# Patient Record
Sex: Female | Born: 1970
Health system: Southern US, Community
[De-identification: ages and names within clinical notes are randomized; demographics above are authoritative.]

## PROBLEM LIST (undated history)

## (undated) DIAGNOSIS — F419 Anxiety disorder, unspecified: Secondary | ICD-10-CM

## (undated) DIAGNOSIS — T7840XA Allergy, unspecified, initial encounter: Secondary | ICD-10-CM

## (undated) DIAGNOSIS — K219 Gastro-esophageal reflux disease without esophagitis: Secondary | ICD-10-CM

## (undated) DIAGNOSIS — E785 Hyperlipidemia, unspecified: Secondary | ICD-10-CM

## (undated) DIAGNOSIS — R7303 Prediabetes: Secondary | ICD-10-CM

## (undated) DIAGNOSIS — D649 Anemia, unspecified: Secondary | ICD-10-CM

## (undated) HISTORY — DX: Allergy, unspecified, initial encounter: T78.40XA

## (undated) HISTORY — PX: OTHER SURGICAL HISTORY: SHX169

## (undated) HISTORY — PX: DENTAL RESTORATION/EXTRACTION WITH X-RAY: SHX5796

## (undated) HISTORY — DX: Hyperlipidemia, unspecified: E78.5

## (undated) HISTORY — DX: Anemia, unspecified: D64.9

## (undated) HISTORY — PX: TUBAL LIGATION: SHX77

## (undated) HISTORY — PX: CHOLECYSTECTOMY: SHX55

## (undated) HISTORY — DX: Anxiety disorder, unspecified: F41.9

---

## 1996-12-02 HISTORY — PX: OTHER SURGICAL HISTORY: SHX169

## 1997-12-02 HISTORY — PX: GALLBLADDER SURGERY: SHX652

## 2005-09-26 ENCOUNTER — Emergency Department: Payer: Self-pay | Admitting: Emergency Medicine

## 2006-06-17 ENCOUNTER — Ambulatory Visit: Payer: Self-pay | Admitting: General Surgery

## 2006-08-26 ENCOUNTER — Ambulatory Visit: Payer: Self-pay

## 2007-06-11 ENCOUNTER — Emergency Department: Payer: Self-pay

## 2008-06-06 ENCOUNTER — Ambulatory Visit: Payer: Self-pay

## 2008-06-07 ENCOUNTER — Inpatient Hospital Stay: Payer: Self-pay

## 2011-03-13 ENCOUNTER — Ambulatory Visit: Payer: Self-pay | Admitting: Family Medicine

## 2013-12-31 LAB — HEPATIC FUNCTION PANEL
ALT: 11 U/L (ref 7–35)
AST: 12 U/L — AB (ref 13–35)

## 2013-12-31 LAB — BASIC METABOLIC PANEL
BUN: 10 mg/dL (ref 4–21)
Creatinine: 0.7 mg/dL (ref 0.5–1.1)
Glucose: 94 mg/dL
POTASSIUM: 4.4 mmol/L (ref 3.4–5.3)
SODIUM: 140 mmol/L (ref 137–147)

## 2013-12-31 LAB — CBC AND DIFFERENTIAL
HCT: 34 % — AB (ref 36–46)
HEMOGLOBIN: 10.9 g/dL — AB (ref 12.0–16.0)
Platelets: 363 10*3/uL (ref 150–399)
WBC: 5.3 10*3/mL

## 2013-12-31 LAB — HEMOGLOBIN A1C: Hgb A1c MFr Bld: 6.1 % — AB (ref 4.0–6.0)

## 2013-12-31 LAB — LIPID PANEL
CHOLESTEROL: 183 mg/dL (ref 0–200)
HDL: 62 mg/dL (ref 35–70)
LDL Cholesterol: 105 mg/dL
TRIGLYCERIDES: 81 mg/dL (ref 40–160)

## 2013-12-31 LAB — TSH: TSH: 0.7 u[IU]/mL (ref 0.41–5.90)

## 2015-08-22 DIAGNOSIS — N922 Excessive menstruation at puberty: Secondary | ICD-10-CM | POA: Insufficient documentation

## 2015-08-22 DIAGNOSIS — S8990XA Unspecified injury of unspecified lower leg, initial encounter: Secondary | ICD-10-CM | POA: Insufficient documentation

## 2015-08-22 DIAGNOSIS — S99929A Unspecified injury of unspecified foot, initial encounter: Secondary | ICD-10-CM

## 2015-08-22 DIAGNOSIS — D649 Anemia, unspecified: Secondary | ICD-10-CM | POA: Insufficient documentation

## 2015-08-22 DIAGNOSIS — E7800 Pure hypercholesterolemia, unspecified: Secondary | ICD-10-CM | POA: Insufficient documentation

## 2015-08-22 DIAGNOSIS — J309 Allergic rhinitis, unspecified: Secondary | ICD-10-CM | POA: Insufficient documentation

## 2015-08-22 DIAGNOSIS — R7303 Prediabetes: Secondary | ICD-10-CM | POA: Insufficient documentation

## 2015-08-22 DIAGNOSIS — R079 Chest pain, unspecified: Secondary | ICD-10-CM | POA: Insufficient documentation

## 2015-08-22 DIAGNOSIS — E669 Obesity, unspecified: Secondary | ICD-10-CM | POA: Insufficient documentation

## 2015-08-22 DIAGNOSIS — M25569 Pain in unspecified knee: Secondary | ICD-10-CM | POA: Insufficient documentation

## 2015-08-22 DIAGNOSIS — F419 Anxiety disorder, unspecified: Secondary | ICD-10-CM | POA: Insufficient documentation

## 2015-08-22 DIAGNOSIS — Z8742 Personal history of other diseases of the female genital tract: Secondary | ICD-10-CM | POA: Insufficient documentation

## 2015-08-22 DIAGNOSIS — S99919A Unspecified injury of unspecified ankle, initial encounter: Secondary | ICD-10-CM | POA: Insufficient documentation

## 2015-08-23 ENCOUNTER — Encounter: Payer: Self-pay | Admitting: Physician Assistant

## 2015-08-23 ENCOUNTER — Ambulatory Visit (INDEPENDENT_AMBULATORY_CARE_PROVIDER_SITE_OTHER): Payer: 59 | Admitting: Physician Assistant

## 2015-08-23 VITALS — BP 140/80 | HR 66 | Temp 98.0°F | Resp 16 | Ht 62.0 in | Wt 254.0 lb

## 2015-08-23 DIAGNOSIS — R7309 Other abnormal glucose: Secondary | ICD-10-CM

## 2015-08-23 DIAGNOSIS — Z23 Encounter for immunization: Secondary | ICD-10-CM

## 2015-08-23 DIAGNOSIS — Z713 Dietary counseling and surveillance: Secondary | ICD-10-CM | POA: Diagnosis not present

## 2015-08-23 DIAGNOSIS — E7800 Pure hypercholesterolemia, unspecified: Secondary | ICD-10-CM

## 2015-08-23 DIAGNOSIS — D509 Iron deficiency anemia, unspecified: Secondary | ICD-10-CM

## 2015-08-23 DIAGNOSIS — E559 Vitamin D deficiency, unspecified: Secondary | ICD-10-CM | POA: Diagnosis not present

## 2015-08-23 DIAGNOSIS — Z6841 Body Mass Index (BMI) 40.0 and over, adult: Secondary | ICD-10-CM

## 2015-08-23 DIAGNOSIS — R7303 Prediabetes: Secondary | ICD-10-CM

## 2015-08-23 DIAGNOSIS — Z1239 Encounter for other screening for malignant neoplasm of breast: Secondary | ICD-10-CM | POA: Diagnosis not present

## 2015-08-23 DIAGNOSIS — Z0000 Encounter for general adult medical examination without abnormal findings: Secondary | ICD-10-CM

## 2015-08-23 MED ORDER — PHENTERMINE HCL 37.5 MG PO TABS: 37.5000 mg | ORAL_TABLET | Freq: Every day | ORAL | Status: DC

## 2015-08-23 NOTE — Patient Instructions (Signed)
Health Maintenance Adopting a healthy lifestyle and getting preventive care can go a long way to promote health and wellness. Talk with your health care provider about what schedule of regular examinations is right for you. This is a good chance for you to check in with your provider about disease prevention and staying healthy. In between checkups, there are plenty of things you can do on your own. Experts have done a lot of research about which lifestyle changes and preventive measures are most likely to keep you healthy. Ask your health care provider for more information. WEIGHT AND DIET  Eat a healthy diet 1. Be sure to include plenty of vegetables, fruits, low-fat dairy products, and lean protein. 2. Do not eat a lot of foods high in solid fats, added sugars, or salt. 3. Get regular exercise. This is one of the most important things you can do for your health. 1. Most adults should exercise for at least 150 minutes each week. The exercise should increase your heart rate and make you sweat (moderate-intensity exercise). 2. Most adults should also do strengthening exercises at least twice a week. This is in addition to the moderate-intensity exercise.  Maintain a healthy weight 1. Body mass index (BMI) is a measurement that can be used to identify possible weight problems. It estimates body fat based on height and weight. Your health care provider can help determine your BMI and help you achieve or maintain a healthy weight. 2. For females 6 years of age and older:  1. A BMI below 18.5 is considered underweight. 2. A BMI of 18.5 to 24.9 is normal. 3. A BMI of 25 to 29.9 is considered overweight. 4. A BMI of 30 and above is considered obese.  Watch levels of cholesterol and blood lipids 1. You should start having your blood tested for lipids and cholesterol at 43 years of age, then have this test every 5 years. 2. You may need to have your cholesterol levels checked more often if: 1. Your  lipid or cholesterol levels are high. 2. You are older than 43 years of age. 3. You are at high risk for heart disease.  CANCER SCREENING   Lung Cancer 1. Lung cancer screening is recommended for adults 7-87 years old who are at high risk for lung cancer because of a history of smoking. 2. A yearly low-dose CT scan of the lungs is recommended for people who: 1. Currently smoke. 2. Have quit within the past 15 years. 3. Have at least a 30-pack-year history of smoking. A pack year is smoking an average of one pack of cigarettes a day for 1 year. 3. Yearly screening should continue until it has been 15 years since you quit. 4. Yearly screening should stop if you develop a health problem that would prevent you from having lung cancer treatment.  Breast Cancer  Practice breast self-awareness. This means understanding how your breasts normally appear and feel.  It also means doing regular breast self-exams. Let your health care provider know about any changes, no matter how small.  If you are in your 20s or 30s, you should have a clinical breast exam (CBE) by a health care provider every 1-3 years as part of a regular health exam.  If you are 30 or older, have a CBE every year. Also consider having a breast X-Madlock (mammogram) every year.  If you have a family history of breast cancer, talk to your health care provider about genetic screening.  If you are  at high risk for breast cancer, talk to your health care provider about having an MRI and a mammogram every year.  Breast cancer gene (BRCA) assessment is recommended for women who have family members with BRCA-related cancers. BRCA-related cancers include:  Breast.  Ovarian.  Tubal.  Peritoneal cancers.  Results of the assessment will determine the need for genetic counseling and BRCA1 and BRCA2 testing. Cervical Cancer Routine pelvic examinations to screen for cervical cancer are no longer recommended for nonpregnant women who  are considered low risk for cancer of the pelvic organs (ovaries, uterus, and vagina) and who do not have symptoms. A pelvic examination may be necessary if you have symptoms including those associated with pelvic infections. Ask your health care provider if a screening pelvic exam is right for you.   The Pap test is the screening test for cervical cancer for women who are considered at risk.  If you had a hysterectomy for a problem that was not cancer or a condition that could lead to cancer, then you no longer need Pap tests.  If you are older than 65 years, and you have had normal Pap tests for the past 10 years, you no longer need to have Pap tests.  If you have had past treatment for cervical cancer or a condition that could lead to cancer, you need Pap tests and screening for cancer for at least 20 years after your treatment.  If you no longer get a Pap test, assess your risk factors if they change (such as having a new sexual partner). This can affect whether you should start being screened again.  Some women have medical problems that increase their chance of getting cervical cancer. If this is the case for you, your health care provider may recommend more frequent screening and Pap tests.  The human papillomavirus (HPV) test is another test that may be used for cervical cancer screening. The HPV test looks for the virus that can cause cell changes in the cervix. The cells collected during the Pap test can be tested for HPV.  The HPV test can be used to screen women 2 years of age and older. Getting tested for HPV can extend the interval between normal Pap tests from three to five years.  An HPV test also should be used to screen women of any age who have unclear Pap test results.  After 43 years of age, women should have HPV testing as often as Pap tests.  Colorectal Cancer  This type of cancer can be detected and often prevented.  Routine colorectal cancer screening usually  begins at 43 years of age and continues through 43 years of age.  Your health care provider may recommend screening at an earlier age if you have risk factors for colon cancer.  Your health care provider may also recommend using home test kits to check for hidden blood in the stool.  A small camera at the end of a tube can be used to examine your colon directly (sigmoidoscopy or colonoscopy). This is done to check for the earliest forms of colorectal cancer.  Routine screening usually begins at age 57.  Direct examination of the colon should be repeated every 5-10 years through 43 years of age. However, you may need to be screened more often if early forms of precancerous polyps or small growths are found. Skin Cancer  Check your skin from head to toe regularly.  Tell your health care provider about any new moles or changes in  moles, especially if there is a change in a mole's shape or color.  Also tell your health care provider if you have a mole that is larger than the size of a pencil eraser.  Always use sunscreen. Apply sunscreen liberally and repeatedly throughout the day.  Protect yourself by wearing long sleeves, pants, a wide-brimmed hat, and sunglasses whenever you are outside. HEART DISEASE, DIABETES, AND HIGH BLOOD PRESSURE   Have your blood pressure checked at least every 1-2 years. High blood pressure causes heart disease and increases the risk of stroke.  If you are between 32 years and 30 years old, ask your health care provider if you should take aspirin to prevent strokes.  Have regular diabetes screenings. This involves taking a blood sample to check your fasting blood sugar level.  If you are at a normal weight and have a low risk for diabetes, have this test once every three years after 43 years of age.  If you are overweight and have a high risk for diabetes, consider being tested at a younger age or more often. PREVENTING INFECTION  Hepatitis B  If you have a  higher risk for hepatitis B, you should be screened for this virus. You are considered at high risk for hepatitis B if:  You were born in a country where hepatitis B is common. Ask your health care provider which countries are considered high risk.  Your parents were born in a high-risk country, and you have not been immunized against hepatitis B (hepatitis B vaccine).  You have HIV or AIDS.  You use needles to inject street drugs.  You live with someone who has hepatitis B.  You have had sex with someone who has hepatitis B.  You get hemodialysis treatment.  You take certain medicines for conditions, including cancer, organ transplantation, and autoimmune conditions. Hepatitis C  Blood testing is recommended for:  Everyone born from [birth year] through 1964.  Anyone with known risk factors for hepatitis C. Sexually transmitted infections (STIs)  You should be screened for sexually transmitted infections (STIs) including gonorrhea and chlamydia if:  You are sexually active and are younger than 43 years of age.  You are older than 43 years of age and your health care provider tells you that you are at risk for this type of infection.  Your sexual activity has changed since you were last screened and you are at an increased risk for chlamydia or gonorrhea. Ask your health care provider if you are at risk.  If you do not have HIV, but are at risk, it may be recommended that you take a prescription medicine daily to prevent HIV infection. This is called pre-exposure prophylaxis (PrEP). You are considered at risk if:  You are sexually active and do not regularly use condoms or know the HIV status of your partner(s).  You take drugs by injection.  You are sexually active with a partner who has HIV. Talk with your health care provider about whether you are at high risk of being infected with HIV. If you choose to begin PrEP, you should first be tested for HIV. You should then be tested  every 3 months for as long as you are taking PrEP.  PREGNANCY   If you are premenopausal and you may become pregnant, ask your health care provider about preconception counseling.  If you may become pregnant, take 400 to 800 micrograms (mcg) of folic acid every day.  If you want to prevent pregnancy, talk to your  health care provider about birth control (contraception). OSTEOPOROSIS AND MENOPAUSE   Osteoporosis is a disease in which the bones lose minerals and strength with aging. This can result in serious bone fractures. Your risk for osteoporosis can be identified using a bone density scan.  If you are 34 years of age or older, or if you are at risk for osteoporosis and fractures, ask your health care provider if you should be screened.  Ask your health care provider whether you should take a calcium or vitamin D supplement to lower your risk for osteoporosis.  Menopause may have certain physical symptoms and risks.  Hormone replacement therapy may reduce some of these symptoms and risks. Talk to your health care provider about whether hormone replacement therapy is right for you.  HOME CARE INSTRUCTIONS   Schedule regular health, dental, and eye exams.  Stay current with your immunizations.   Do not use any tobacco products including cigarettes, chewing tobacco, or electronic cigarettes.  If you are pregnant, do not drink alcohol.  If you are breastfeeding, limit how much and how often you drink alcohol.  Limit alcohol intake to no more than 1 drink per day for nonpregnant women. One drink equals 12 ounces of beer, 5 ounces of wine, or 1 ounces of hard liquor.  Do not use street drugs.  Do not share needles.  Ask your health care provider for help if you need support or information about quitting drugs.  Tell your health care provider if you often feel depressed.  Tell your health care provider if you have ever been abused or do not feel safe at home. Document  Released: 06/03/2011 Document Revised: 04/04/2014 Document Reviewed: 10/20/2013 [facility] Patient Information 2015 [place], [place]. This information is not intended to replace advice given to you by your health care provider. Make sure you discuss any questions you have with your health care provider.     Why follow it? Research shows. . Those who follow the Mediterranean diet have a reduced risk of heart disease  . The diet is associated with a reduced incidence of Parkinson's and Alzheimer's diseases . People following the diet may have longer life expectancies and lower rates of chronic diseases  . The Dietary Guidelines for Americans recommends the Mediterranean diet as an eating plan to promote health and prevent disease  What Is the Mediterranean Diet?  . Healthy eating plan based on typical foods and recipes of Mediterranean-style cooking . The diet is primarily a plant based diet; these foods should make up a majority of meals   Starches - Plant based foods should make up a majority of meals - They are an important sources of vitamins, minerals, energy, antioxidants, and fiber - Choose whole grains, foods high in fiber and minimally processed items  - Typical grain sources include wheat, oats, barley, corn, brown rice, bulgar, farro, millet, polenta, couscous  - Various types of beans include chickpeas, lentils, fava beans, black beans, white beans   Fruits  Veggies - Large quantities of antioxidant rich fruits & veggies; 6 or more servings  - Vegetables can be eaten raw or lightly drizzled with oil and cooked  - Vegetables common to the traditional Mediterranean Diet include: artichokes, arugula, beets, broccoli, brussel sprouts, cabbage, carrots, celery, collard greens, cucumbers, eggplant, kale, leeks, lemons, lettuce, mushrooms, okra, onions, peas, peppers, potatoes, pumpkin, radishes, rutabaga, shallots, spinach, sweet potatoes, turnips, zucchini - Fruits common to the Mediterranean  Diet include: apples, apricots, avocados, cherries, clementines, dates, figs, grapefruits,  grapes, melons, nectarines, oranges, peaches, pears, pomegranates, strawberries, tangerines  Fats - Replace butter and margarine with healthy oils, such as olive oil, canola oil, and tahini  - Limit nuts to no more than a handful a day  - Nuts include walnuts, almonds, pecans, pistachios, pine nuts  - Limit or avoid candied, honey roasted or heavily salted nuts - Olives are central to the Mediterranean diet - can be eaten whole or used in a variety of dishes   Meats Protein - Limiting red meat: no more than a few times a month - When eating red meat: choose lean cuts and keep the portion to the size of deck of cards - Eggs: approx. 0 to 4 times a week  - Fish and lean poultry: at least 2 a week  - Healthy protein sources include, chicken, [place], lean beef, lamb - Increase intake of seafood such as tuna, salmon, trout, mackerel, shrimp, scallops - Avoid or limit high fat processed meats such as sausage and bacon  Dairy - Include moderate amounts of low fat dairy products  - Focus on healthy dairy such as fat free yogurt, skim milk, low or reduced fat cheese - Limit dairy products higher in fat such as whole or 2% milk, cheese, ice cream  Alcohol - Moderate amounts of red wine is ok  - No more than 5 oz daily for women (all ages) and men older than age 74  - No more than 10 oz of wine daily for men younger than 44  Other - Limit sweets and other desserts  - Use herbs and spices instead of salt to flavor foods  - Herbs and spices common to the traditional Mediterranean Diet include: basil, bay leaves, chives, cloves, cumin, fennel, garlic, lavender, marjoram, mint, oregano, parsley, pepper, rosemary, sage, savory, sumac, tarragon, thyme   It's not just a diet, it's a lifestyle:  . The Mediterranean diet includes lifestyle factors typical of those in the region  . Foods, drinks and meals are best eaten  with others and savored . Daily physical activity is important for overall good health . This could be strenuous exercise like running and aerobics . This could also be more leisurely activities such as walking, housework, yard-work, or taking the stairs . Moderation is the key; a balanced and healthy diet accommodates most foods and drinks . Consider portion sizes and frequency of consumption of certain foods   Meal Ideas & Options:  . Breakfast:  o Whole wheat toast or whole wheat English muffins with peanut butter & hard boiled egg o Steel cut oats topped with apples & cinnamon and skim milk  o Fresh fruit: banana, strawberries, melon, berries, peaches  o Smoothies: strawberries, bananas, greek yogurt, peanut butter o Low fat greek yogurt with blueberries and granola  o Egg white omelet with spinach and mushrooms o Breakfast couscous: whole wheat couscous, apricots, skim milk, cranberries  . Sandwiches:  o Hummus and grilled vegetables (peppers, zucchini, squash) on whole wheat bread   o Grilled chicken on whole wheat pita with lettuce, tomatoes, cucumbers or tzatziki  o Tuna salad on whole wheat bread: tuna salad made with greek yogurt, olives, red peppers, capers, green onions o Garlic rosemary lamb pita: lamb sauted with garlic, rosemary, salt & pepper; add lettuce, cucumber, greek yogurt to pita - flavor with lemon juice and black pepper  . Seafood:  o Mediterranean grilled salmon, seasoned with garlic, basil, parsley, lemon juice and black pepper o Shrimp, lemon, and spinach  whole-grain pasta salad made with low fat greek yogurt  o Seared scallops with lemon orzo  o Seared tuna steaks seasoned salt, pepper, coriander topped with tomato mixture of olives, tomatoes, olive oil, minced garlic, parsley, green onions and cappers  . Meats:  o Herbed greek chicken salad with kalamata olives, cucumber, feta  o Red bell peppers stuffed with spinach, bulgur, lean ground beef (or lentils) &  topped with feta   o Kebabs: skewers of chicken, tomatoes, onions, zucchini, squash  o [place] burgers: made with red onions, mint, dill, lemon juice, feta cheese topped with roasted red peppers . Vegetarian o Cucumber salad: cucumbers, artichoke hearts, celery, red onion, feta cheese, tossed in olive oil & lemon juice  o Hummus and whole grain pita points with a greek salad (lettuce, tomato, feta, olives, cucumbers, red onion) o Lentil soup with celery, carrots made with vegetable broth, garlic, salt and pepper  o Tabouli salad: parsley, bulgur, mint, scallions, cucumbers, tomato, radishes, lemon juice, olive oil, salt and pepper.      American Heart Association (AHA) Exercise Recommendation  Being physically active is important to prevent heart disease and stroke, the nation's No. 1and No. 5killers. To improve overall cardiovascular health, we suggest at least 150 minutes per week of moderate exercise or 75 minutes per week of vigorous exercise (or a combination of moderate and vigorous activity). Thirty minutes a day, five times a week is an easy goal to remember. You will also experience benefits even if you divide your time into two or three segments of 10 to 15 minutes per day.  For people who would benefit from lowering their blood pressure or cholesterol, we recommend 40 minutes of aerobic exercise of moderate to vigorous intensity three to four times a week to lower the risk for heart attack and stroke.  Physical activity is anything that makes you move your body and burn calories.  This includes things like climbing stairs or playing sports. Aerobic exercises benefit your heart, and include walking, jogging, swimming or biking. Strength and stretching exercises are best for overall stamina and flexibility.  The simplest, positive change you can make to effectively improve your heart health is to start walking. It's enjoyable, free, easy, social and great exercise. A walking program is  flexible and boasts high success rates because people can stick with it. It's easy for walking to become a regular and satisfying part of life.   For Overall Cardiovascular Health:  At least 30 minutes of moderate-intensity aerobic activity at least 5 days per week for a total of 150  OR   At least 25 minutes of vigorous aerobic activity at least 3 days per week for a total of 75 minutes; or a combination of moderate- and vigorous-intensity aerobic activity  AND   Moderate- to high-intensity muscle-strengthening activity at least 2 days per week for additional health benefits.  For Lowering Blood Pressure and Cholesterol  An average 40 minutes of moderate- to vigorous-intensity aerobic activity 3 or 4 times per week  What if I can't make it to the time goal? Something is always better than nothing! And everyone has to start somewhere. Even if you've been sedentary for years, today is the day you can begin to make healthy changes in your life. If you don't think you'll make it for 30 or 40 minutes, set a reachable goal for today. You can work up toward your overall goal by increasing your time as you get stronger. Don't let all-or-nothing thinking  rob you of doing what you can every day.  Source:http://www.heart.org

## 2015-08-23 NOTE — Progress Notes (Signed)
Patient: Sarah Monroe, Female    DOB: 27-Jan-1971, 44 y.o.   MRN: [id number] Visit Date: 08/23/2015  Today's Provider: Mar Daring, PA-C   Chief Complaint  Patient presents with  . Annual Exam   Subjective:    Annual physical exam Sarah Monroe is a 44 y.o. female who presents today for health maintenance and complete physical. She feels well. She reports not exercising. She reports she is sleeping fairly well. Patient also reports that Dr. Kenton Kingfisher follows her uterine fibroids and is due for an appointment. Mammogram was ordered on las PCP but patient was not able to get it done due to daughter being sick. Patient is concerned about her left hand. Over the last 3 months she has had increasing pain over the styloid process that will sometimes radiate up into the fifth metacarpal. Occasionally she will have numbness and tingling into the fourth and fifth fingers of the left hand. She does use her left hand for her mouse. She does state that she has used a brace over the last month and that has actually helped her symptoms some. Last Pap smear was January 2015 and was normal. There is no family history of cervical or ovarian cancer. She does have a personal history of uterine fibroids as mentioned above. She continues to have her menstrual cycle. Occasionally it will be heavier than normal. This has not been problematic for her. She does not have dysmenorrhea. She does perform self breast exams monthly. She did not get her mammogram last year. Most recent mammogram from 2 years ago was normal. There is no family history of breast cancer. There is no immediate family history of colon cancer.  She is concerned about weight gain over the last year. She states that approximately 2 years ago she lost 30 pounds and she has since gained all that weight back plus some. She feels this was most likely secondary to the stress and time that was spent dealing with her daughter while she was  going through an illness last year. She is already working out with a Physiological scientist at [employer]. She does this 2-3 times a week. She is also interested in starting Weight Watchers with a coworker.   Last PCP:12/31/2013  Pap Smear: 12/31/2013 per patient Normal. -----------------------------------------------------------------   Review of Systems  Constitutional: Positive for unexpected weight change.  HENT: Positive for rhinorrhea and sneezing.   Eyes: Positive for itching.  Respiratory: Negative.   Cardiovascular: Positive for leg swelling (ankle usually happens before and after menstrual cycles ans if standing for a long period of time).  Gastrointestinal: Positive for constipation.  Endocrine: Negative.   Genitourinary: Negative.   Musculoskeletal: Positive for arthralgias.  Skin: Negative.   Allergic/Immunologic: Positive for environmental allergies.  Neurological: Negative.   Hematological: Bruises/bleeds easily (bruises easily).  Psychiatric/Behavioral: Negative.     Social History She  reports that she has never smoked. She does not have any smokeless tobacco history on file. She reports that she drinks alcohol. She reports that she does not use illicit drugs. Social History   Social History  . Marital Status: Single    Spouse Name: N/A  . Number of Children: N/A  . Years of Education: N/A   Social History Main Topics  . Smoking status: Never Smoker   . Smokeless tobacco: Not on file  . Alcohol Use: Yes  . Drug Use: No  . Sexual Activity: Not on file   Other Topics  Concern  . Not on file   Social History Narrative  . No narrative on file    Patient Active Problem List   Diagnosis Date Noted  . Allergic rhinitis 08/22/2015  . Absolute anemia 08/22/2015  . Anxiety 08/22/2015  . Chest pain 08/22/2015  . Borderline diabetes 08/22/2015  . Hypercholesteremia 08/22/2015  . Injury of knee, leg, ankle and foot 08/22/2015  . Puberty bleeding 08/22/2015  .  Adiposity 08/22/2015  . Pain in joint, lower leg 08/22/2015  . H/O female genital system disorder 08/22/2015    Past Surgical History  Procedure Laterality Date  . Post cholcystectomy  1998  . Cesarean section      x3  . Endometriotic cyst      Dr. Jamal Collin    Family History  Family Status  Relation Status Death Age  . Mother Alive   . Father Alive     Poor health; Internal gastric bleeding 66  . Maternal Grandmother Deceased 55    Pancreatic cancer  . Maternal Grandfather Deceased 40    COPD  . Paternal Grandmother Deceased 87    Coronary artery disease;Myocardial Infarction  . Paternal Grandfather Deceased     died of unknown causes; died before patient was born   Her family history includes Allergies in her mother; Diabetes in her father and mother; Heart murmur in her father; Hernia in her father; Hyperlipidemia in her mother; Hypertension in her father and mother.    Allergies not on file  Previous Medications   CETIRIZINE HCL (ZYRTEC ALLERGY) 10 MG CAPS    Take 10 mg by mouth daily as needed.    Patient Care Team: Mar Daring, PA-C as PCP - General (Physician Assistant)     Objective:   Vitals: There were no vitals taken for this visit.   Physical Exam  Constitutional: She is oriented to person, place, and time. She appears well-developed and well-nourished. No distress.  HENT:  Head: Normocephalic and atraumatic.  Right Ear: External ear normal.  Left Ear: External ear normal.  Nose: Nose normal.  Mouth/Throat: Oropharynx is clear and moist. No oropharyngeal exudate.  Eyes: Conjunctivae and EOM are normal. Pupils are equal, round, and reactive to light. Right eye exhibits no discharge. Left eye exhibits no discharge. No scleral icterus.  Neck: Normal range of motion. Neck supple. No JVD present. No tracheal deviation present. No thyromegaly present.  Cardiovascular: Normal rate, regular rhythm, normal heart sounds and intact distal pulses.  Exam  reveals no gallop and no friction rub.   No murmur heard. Pulmonary/Chest: Effort normal and breath sounds normal. No respiratory distress. She has no wheezes. She has no rales. She exhibits no tenderness. Right breast exhibits no inverted nipple, no mass, no nipple discharge, no skin change and no tenderness. Left breast exhibits no inverted nipple, no mass, no nipple discharge, no skin change and no tenderness. Breasts are symmetrical.  Abdominal: Soft. Bowel sounds are normal. She exhibits no distension and no mass. There is no tenderness. There is no rebound and no guarding.  Genitourinary:  Deferred to Dr. Kenton Kingfisher for when she goes to f/u uterine fibroids  Musculoskeletal: Normal range of motion. She exhibits no edema or tenderness.  Lymphadenopathy:    She has no cervical adenopathy.  Neurological: She is alert and oriented to person, place, and time.  Skin: Skin is warm and dry. No rash noted. She is not diaphoretic.  Psychiatric: She has a normal mood and affect. Her behavior is normal. Judgment  and thought content normal.  Vitals reviewed.    Depression Screen No flowsheet data found.    Assessment & Plan:     Routine Health Maintenance and Physical Exam  Exercise Activities and Dietary recommendations Goals    None      Immunization History  Administered Date(s) Administered  . Td 01/16/2005  . Tdap 12/31/2013    Health Maintenance  Topic Date Due  . HIV Screening  04/14/1987  . PAP SMEAR  04/13/1993  . INFLUENZA VACCINE  07/03/2015  . TETANUS/TDAP  01/01/2024      Discussed health benefits of physical activity, and encouraged her to engage in regular exercise appropriate for her age and condition.   1. Annual physical exam I will check labs for her today. Physical exam was normal. Follow-up pending lab results. - CBC with Differential - Hemoglobin A1c - Lipid panel - TSH - Mammogram Digital Screening; Future - Comprehensive metabolic panel  2. Need  for influenza vaccination Flu vaccine was given today without complication. No adverse reactions. - Flu Vaccine QUAD 36+ mos IM  3. Borderline diabetes She has previously been diagnosed as borderline diabetes. With the weight gain it is best to recheck her hemoglobin A1c. I will follow-up with her pending lab results - Hemoglobin A1c  4. Hypercholesteremia She does have a history of this. She is currently not on any medical therapy for this. I will recheck her cholesterol panel and follow-up with her pending lab results. - Lipid panel  5. Iron deficiency anemia History of this. I will check lab results and follow-up pending lab results. - CBC with Differential  6. Avitaminosis D She does have a history of this. I will check her vitamin D level and follow-up with her pending the results. - Vitamin D (25 hydroxy)  7. Breast cancer screening Mammogram was not done last year. I will order mammogram. I did advise patient to call normal breast clinic to set this appointment up. - Mammogram Digital Screening; Future  8. Morbid obesity with BMI of 45.0-49.9, adult I will add Adipex to assist with her weight loss goals and appetite suppression. She is to continue her physical exercise with her personal trainer. I did advise for her to go forth with the weight watchers counseling as well. I will follow-up with her in 4 weeks to recheck her weight.. - phentermine (ADIPEX-P) 37.5 MG tablet; Take 1 tablet (37.5 mg total) by mouth daily before breakfast.  Dispense: 30 tablet; Refill: 0  9. Encounter for weight loss counseling See above medical treatment plan. --------------------------------------------------------------------

## 2015-08-25 ENCOUNTER — Telehealth: Payer: Self-pay

## 2015-08-25 LAB — CBC WITH DIFFERENTIAL/PLATELET
BASOS: 0 %
Basophils Absolute: 0 10*3/uL (ref 0.0–0.2)
EOS (ABSOLUTE): 0.3 10*3/uL (ref 0.0–0.4)
EOS: 7 %
HEMATOCRIT: 34.1 % (ref 34.0–46.6)
HEMOGLOBIN: 10.8 g/dL — AB (ref 11.1–15.9)
IMMATURE GRANULOCYTES: 0 %
Immature Grans (Abs): 0 10*3/uL (ref 0.0–0.1)
LYMPHS ABS: 1.4 10*3/uL (ref 0.7–3.1)
Lymphs: 29 %
MCH: 23.4 pg — ABNORMAL LOW (ref 26.6–33.0)
MCHC: 31.7 g/dL (ref 31.5–35.7)
MCV: 74 fL — AB (ref 79–97)
MONOCYTES: 7 %
MONOS ABS: 0.3 10*3/uL (ref 0.1–0.9)
NEUTROS PCT: 57 %
Neutrophils Absolute: 2.7 10*3/uL (ref 1.4–7.0)
Platelets: 365 10*3/uL (ref 150–379)
RBC: 4.61 x10E6/uL (ref 3.77–5.28)
RDW: 15.4 % (ref 12.3–15.4)
WBC: 4.7 10*3/uL (ref 3.4–10.8)

## 2015-08-25 LAB — COMPREHENSIVE METABOLIC PANEL
ALT: 17 IU/L (ref 0–32)
AST: 17 IU/L (ref 0–40)
Albumin/Globulin Ratio: 1.4 (ref 1.1–2.5)
Albumin: 4.1 g/dL (ref 3.5–5.5)
Alkaline Phosphatase: 63 IU/L (ref 39–117)
BUN/Creatinine Ratio: 12 (ref 9–23)
BUN: 9 mg/dL (ref 6–24)
Bilirubin Total: 0.3 mg/dL (ref 0.0–1.2)
CALCIUM: 9.5 mg/dL (ref 8.7–10.2)
CO2: 23 mmol/L (ref 18–29)
CREATININE: 0.73 mg/dL (ref 0.57–1.00)
Chloride: 104 mmol/L (ref 97–108)
GFR calc Af Amer: 117 mL/min/{1.73_m2} (ref >59)
GFR, EST NON AFRICAN AMERICAN: 101 mL/min/{1.73_m2} (ref >59)
GLUCOSE: 103 mg/dL — AB (ref 65–99)
Globulin, Total: 3 g/dL (ref 1.5–4.5)
POTASSIUM: 4.4 mmol/L (ref 3.5–5.2)
Sodium: 143 mmol/L (ref 134–144)
Total Protein: 7.1 g/dL (ref 6.0–8.5)

## 2015-08-25 LAB — VITAMIN D 25 HYDROXY (VIT D DEFICIENCY, FRACTURES): VIT D 25 HYDROXY: 19.8 ng/mL — AB (ref 30.0–100.0)

## 2015-08-25 LAB — LIPID PANEL
CHOL/HDL RATIO: 3.3 ratio (ref 0.0–4.4)
Cholesterol, Total: 181 mg/dL (ref 100–199)
HDL: 55 mg/dL (ref >39)
LDL Calculated: 105 mg/dL — ABNORMAL HIGH (ref 0–99)
TRIGLYCERIDES: 106 mg/dL (ref 0–149)
VLDL Cholesterol Cal: 21 mg/dL (ref 5–40)

## 2015-08-25 LAB — HEMOGLOBIN A1C
ESTIMATED AVERAGE GLUCOSE: 131 mg/dL
HEMOGLOBIN A1C: 6.2 % — AB (ref 4.8–5.6)

## 2015-08-25 LAB — TSH: TSH: 0.654 u[IU]/mL (ref 0.450–4.500)

## 2015-08-25 NOTE — Telephone Encounter (Signed)
-----   Message from Mar Daring, [place] sent at 08/25/2015  8:15 AM EDT ----- Hemoglobin is still slightly low but stable from labs one year ago.  Continue iron supplement.  Cholesterol was stable and WNL.  HgBA1c had a slight increase from 6.1 to 6.2 now.  Continue to try to adhere to a low sugar, low carbohydrate, low fat diet.  Continue with exercise.  TSH was WNL.  Vit D continues to be low as well.  Continue vit D supplement.  We will check labs again in one year.

## 2015-08-25 NOTE — Telephone Encounter (Signed)
Patient advised as directed below.  Thanks,  -Joseline 

## 2015-09-20 ENCOUNTER — Ambulatory Visit: Payer: 59 | Admitting: Physician Assistant

## 2015-09-29 ENCOUNTER — Ambulatory Visit (INDEPENDENT_AMBULATORY_CARE_PROVIDER_SITE_OTHER): Payer: 59 | Admitting: Physician Assistant

## 2015-09-29 ENCOUNTER — Encounter: Payer: Self-pay | Admitting: Physician Assistant

## 2015-09-29 VITALS — BP 138/80 | HR 72 | Temp 97.9°F | Resp 16 | Wt 245.4 lb

## 2015-09-29 DIAGNOSIS — Z6841 Body Mass Index (BMI) 40.0 and over, adult: Secondary | ICD-10-CM | POA: Diagnosis not present

## 2015-09-29 DIAGNOSIS — Z713 Dietary counseling and surveillance: Secondary | ICD-10-CM

## 2015-09-29 MED ORDER — PHENTERMINE HCL 37.5 MG PO TABS: 37.5000 mg | ORAL_TABLET | Freq: Every day | ORAL | Status: DC

## 2015-09-29 NOTE — Progress Notes (Signed)
       Patient: Sarah Monroe Female    DOB: [birth year]   44 y.o.   MRN: [id number] Visit Date: 09/29/2015  Today's Provider: Mar Daring, PA-C   Chief Complaint  Patient presents with  . Follow-up    Wt   Subjective:    HPI  Sarah Monroe is a 44 y.o. female who presents today for weight follow up. Last office visit patient was concerned about weight gain over the last year.She was already working out with a Physiological scientist at [employer]. She was doing this 2-3 times a week. Per patient no longer with the trainer and has not been going to [employer] recently. The classes that patient was taking with the trainer were free.She is also interested in starting Weight Watchers with a coworker. Per patient has not been able to start the weight watchers because friend got sick, but going to start next week. Last office visit patient was started on Phentermine to help with the weight and appetite. Last ov weight was 254 lbs now is 245.4 lb. Patient reports eating healthy, fruits, vegetables,and exercises 3 days a week with a treadmill and Elliptical.    No Known Allergies Previous Medications   CETIRIZINE HCL (ZYRTEC ALLERGY) 10 MG CAPS    Take 10 mg by mouth daily as needed.   CHOLECALCIFEROL (VITAMIN D) 1000 UNITS TABLET    Take 1,000 Units by mouth daily.   PHENTERMINE (ADIPEX-P) 37.5 MG TABLET    Take 1 tablet (37.5 mg total) by mouth daily before breakfast.    Review of Systems  Constitutional: Negative.   Respiratory: Negative.   Cardiovascular: Negative.   Gastrointestinal: Negative.   Musculoskeletal: Negative.     Social History  Substance Use Topics  . Smoking status: Never Smoker   . Smokeless tobacco: Not on file  . Alcohol Use: Yes     Comment: occassional   Objective:   BP 138/80 mmHg  Pulse 72  Temp(Src) 97.9 F (36.6 C) (Oral)  Resp 16  Wt 245 lb 6.4 oz (111.313 kg)  LMP 09/27/2015  Physical Exam  Constitutional: She appears well-developed and  well-nourished. No distress.  Cardiovascular: Normal rate, regular rhythm and normal heart sounds.  Exam reveals no gallop and no friction rub.   No murmur heard. Pulmonary/Chest: Effort normal and breath sounds normal. No respiratory distress. She has no wheezes. She has no rales.  Skin: She is not diaphoretic.  Vitals reviewed.       Assessment & Plan:     1. Encounter for weight loss counseling She has been doing very well. I did encourage her to keep up the good work and to continue her diet. I did discuss how once she adds the increased physical activity with the classes at [facility] gym she will continue to see the weight loss. I will refill her phentermine as below. I will see her back in 4 weeks to continue to monitor her weight.  - phentermine (ADIPEX-P) 37.5 MG tablet; Take 1 tablet (37.5 mg total) by mouth daily before breakfast.  Dispense: 30 tablet; Refill: 0  2. Morbid obesity due to excess calories [facility]) See above medical treatment plan.  3. BMI 40.0-44.9, adult [facility]) See above medical treatment plan.      Mar Daring, PA-C  [facility]

## 2015-09-29 NOTE — Patient Instructions (Signed)

## 2015-10-27 ENCOUNTER — Ambulatory Visit: Payer: 59 | Admitting: Physician Assistant

## 2015-10-30 ENCOUNTER — Ambulatory Visit: Payer: 59 | Admitting: Physician Assistant

## 2016-07-05 ENCOUNTER — Ambulatory Visit (INDEPENDENT_AMBULATORY_CARE_PROVIDER_SITE_OTHER): Payer: 59 | Admitting: Family Medicine

## 2016-07-05 ENCOUNTER — Encounter: Payer: Self-pay | Admitting: Family Medicine

## 2016-07-05 VITALS — BP 130/72 | HR 60 | Temp 98.5°F | Resp 16 | Wt 230.8 lb

## 2016-07-05 DIAGNOSIS — N760 Acute vaginitis: Secondary | ICD-10-CM

## 2016-07-05 MED ORDER — FLUCONAZOLE 150 MG PO TABS: 150.0000 mg | ORAL_TABLET | Freq: Once | ORAL | 1 refills | Status: AC

## 2016-07-05 NOTE — Patient Instructions (Signed)
We will call you with the urine results-it usually takes a few days for this to be tested.

## 2016-07-05 NOTE — Progress Notes (Signed)
Subjective:     Patient ID: Sarah Monroe, female   DOB: 29-Jan-1971, 45 y.o.   MRN: [id number]  HPI  Chief Complaint  Patient presents with  . Vaginitis    Patient comes in office today with concerns of vaginal irritation that began at the end of her menstrual cycle on 06/29/16. Patient reports symptoms of vaginal burning and itching with a milky white discharge. Patient denies any vaginal odor, she states that she has tried a once dose treatment otc Monistat.    States she is in a monogamous relationship but wishes to be checked for STD though no history of the same.   Review of Systems     Objective:   Physical Exam  Constitutional: She appears well-developed and well-nourished. No distress.       Assessment:    1. Vaginitis: cover for Candida pending urine tests. - Chlamydia/Gonococcus/Trichomonas, NAA - fluconazole (DIFLUCAN) 150 MG tablet; Take 1 tablet (150 mg total) by mouth once.  Dispense: 1 tablet; Refill: 1    Plan:   Further f/u pending lab work.

## 2016-07-07 LAB — CHLAMYDIA/GONOCOCCUS/TRICHOMONAS, NAA
Chlamydia by NAA: NEGATIVE
Gonococcus by NAA: NEGATIVE
TRICH VAG BY NAA: POSITIVE — AB

## 2016-07-08 ENCOUNTER — Other Ambulatory Visit: Payer: Self-pay | Admitting: Family Medicine

## 2016-07-08 ENCOUNTER — Telehealth: Payer: Self-pay

## 2016-07-08 DIAGNOSIS — A5901 Trichomonal vulvovaginitis: Secondary | ICD-10-CM

## 2016-07-08 MED ORDER — METRONIDAZOLE 500 MG PO TABS: 500.0000 mg | ORAL_TABLET | Freq: Two times a day (BID) | ORAL | 0 refills | Status: DC

## 2016-07-08 NOTE — Telephone Encounter (Signed)
Patient was advised of lab report and verbal understanding. KW

## 2016-07-08 NOTE — Telephone Encounter (Signed)
-----   Message from Carmon Ginsberg, [place] sent at 07/08/2016  7:48 AM EDT ----- You have a trichomonas infection which can be sexually transmitted. We will send in metronidazole which will treat this. I have seen women who have not been sexually active for a long time harbor this organism and it was unclear when they contracted it.

## 2016-09-24 ENCOUNTER — Ambulatory Visit (INDEPENDENT_AMBULATORY_CARE_PROVIDER_SITE_OTHER): Payer: 59 | Admitting: Physician Assistant

## 2016-09-24 ENCOUNTER — Encounter: Payer: Self-pay | Admitting: Physician Assistant

## 2016-09-24 ENCOUNTER — Telehealth: Payer: Self-pay | Admitting: Physician Assistant

## 2016-09-24 VITALS — BP 122/78 | HR 68 | Temp 98.6°F | Resp 16 | Ht 61.5 in | Wt 238.0 lb

## 2016-09-24 DIAGNOSIS — R7303 Prediabetes: Secondary | ICD-10-CM

## 2016-09-24 DIAGNOSIS — E7800 Pure hypercholesterolemia, unspecified: Secondary | ICD-10-CM

## 2016-09-24 DIAGNOSIS — Z1239 Encounter for other screening for malignant neoplasm of breast: Secondary | ICD-10-CM

## 2016-09-24 DIAGNOSIS — E559 Vitamin D deficiency, unspecified: Secondary | ICD-10-CM | POA: Diagnosis not present

## 2016-09-24 DIAGNOSIS — Z1231 Encounter for screening mammogram for malignant neoplasm of breast: Secondary | ICD-10-CM

## 2016-09-24 DIAGNOSIS — Z862 Personal history of diseases of the blood and blood-forming organs and certain disorders involving the immune mechanism: Secondary | ICD-10-CM | POA: Diagnosis not present

## 2016-09-24 DIAGNOSIS — Z6841 Body Mass Index (BMI) 40.0 and over, adult: Secondary | ICD-10-CM

## 2016-09-24 DIAGNOSIS — Z800 Family history of malignant neoplasm of digestive organs: Secondary | ICD-10-CM

## 2016-09-24 DIAGNOSIS — Z0000 Encounter for general adult medical examination without abnormal findings: Secondary | ICD-10-CM | POA: Diagnosis not present

## 2016-09-24 NOTE — Patient Instructions (Addendum)
Health Maintenance, Female Adopting a healthy lifestyle and getting preventive care can go a long way to promote health and wellness. Talk with your health care provider about what schedule of regular examinations is right for you. This is a good chance for you to check in with your provider about disease prevention and staying healthy. In between checkups, there are plenty of things you can do on your own. Experts have done a lot of research about which lifestyle changes and preventive measures are most likely to keep you healthy. Ask your health care provider for more information. WEIGHT AND DIET  Eat a healthy diet  Be sure to include plenty of vegetables, fruits, low-fat dairy products, and lean protein.  Do not eat a lot of foods high in solid fats, added sugars, or salt.  Get regular exercise. This is one of the most important things you can do for your health.  Most adults should exercise for at least 150 minutes each week. The exercise should increase your heart rate and make you sweat (moderate-intensity exercise).  Most adults should also do strengthening exercises at least twice a week. This is in addition to the moderate-intensity exercise.  Maintain a healthy weight  Body mass index (BMI) is a measurement that can be used to identify possible weight problems. It estimates body fat based on height and weight. Your health care provider can help determine your BMI and help you achieve or maintain a healthy weight.  For females 20 years of age and older:   A BMI below 18.5 is considered underweight.  A BMI of 18.5 to 24.9 is normal.  A BMI of 25 to 29.9 is considered overweight.  A BMI of 30 and above is considered obese.  Watch levels of cholesterol and blood lipids  You should start having your blood tested for lipids and cholesterol at 44 years of age, then have this test every 5 years.  You may need to have your cholesterol levels checked more often if:  Your lipid  or cholesterol levels are high.  You are older than 44 years of age.  You are at high risk for heart disease.  CANCER SCREENING   Lung Cancer  Lung cancer screening is recommended for adults 55-80 years old who are at high risk for lung cancer because of a history of smoking.  A yearly low-dose CT scan of the lungs is recommended for people who:  Currently smoke.  Have quit within the past 15 years.  Have at least a 30-pack-year history of smoking. A pack year is smoking an average of one pack of cigarettes a day for 1 year.  Yearly screening should continue until it has been 15 years since you quit.  Yearly screening should stop if you develop a health problem that would prevent you from having lung cancer treatment.  Breast Cancer  Practice breast self-awareness. This means understanding how your breasts normally appear and feel.  It also means doing regular breast self-exams. Let your health care provider know about any changes, no matter how small.  If you are in your 20s or 30s, you should have a clinical breast exam (CBE) by a health care provider every 1-3 years as part of a regular health exam.  If you are 40 or older, have a CBE every year. Also consider having a breast X-ray (mammogram) every year.  If you have a family history of breast cancer, talk to your health care provider about genetic screening.  If you   are at high risk for breast cancer, talk to your health care provider about having an MRI and a mammogram every year.  Breast cancer gene (BRCA) assessment is recommended for women who have family members with BRCA-related cancers. BRCA-related cancers include:  Breast.  Ovarian.  Tubal.  Peritoneal cancers.  Results of the assessment will determine the need for genetic counseling and BRCA1 and BRCA2 testing. Cervical Cancer Your health care provider may recommend that you be screened regularly for cancer of the pelvic organs (ovaries, uterus, and  vagina). This screening involves a pelvic examination, including checking for microscopic changes to the surface of your cervix (Pap test). You may be encouraged to have this screening done every 3 years, beginning at age 21.  For women ages 30-65, health care providers may recommend pelvic exams and Pap testing every 3 years, or they may recommend the Pap and pelvic exam, combined with testing for human papilloma virus (HPV), every 5 years. Some types of HPV increase your risk of cervical cancer. Testing for HPV may also be done on women of any age with unclear Pap test results.  Other health care providers may not recommend any screening for nonpregnant women who are considered low risk for pelvic cancer and who do not have symptoms. Ask your health care provider if a screening pelvic exam is right for you.  If you have had past treatment for cervical cancer or a condition that could lead to cancer, you need Pap tests and screening for cancer for at least 20 years after your treatment. If Pap tests have been discontinued, your risk factors (such as having a new sexual partner) need to be reassessed to determine if screening should resume. Some women have medical problems that increase the chance of getting cervical cancer. In these cases, your health care provider may recommend more frequent screening and Pap tests. Colorectal Cancer  This type of cancer can be detected and often prevented.  Routine colorectal cancer screening usually begins at 44 years of age and continues through 44 years of age.  Your health care provider may recommend screening at an earlier age if you have risk factors for colon cancer.  Your health care provider may also recommend using home test kits to check for hidden blood in the stool.  A small camera at the end of a tube can be used to examine your colon directly (sigmoidoscopy or colonoscopy). This is done to check for the earliest forms of colorectal  cancer.  Routine screening usually begins at age 50.  Direct examination of the colon should be repeated every 5-10 years through 44 years of age. However, you may need to be screened more often if early forms of precancerous polyps or small growths are found. Skin Cancer  Check your skin from head to toe regularly.  Tell your health care provider about any new moles or changes in moles, especially if there is a change in a mole's shape or color.  Also tell your health care provider if you have a mole that is larger than the size of a pencil eraser.  Always use sunscreen. Apply sunscreen liberally and repeatedly throughout the day.  Protect yourself by wearing long sleeves, pants, a wide-brimmed hat, and sunglasses whenever you are outside. HEART DISEASE, DIABETES, AND HIGH BLOOD PRESSURE   High blood pressure causes heart disease and increases the risk of stroke. High blood pressure is more likely to develop in:  People who have blood pressure in the high end   of the normal range (130-139/85-89 mm Hg).  People who are overweight or obese.  People who are African American.  If you are 38-23 years of age, have your blood pressure checked every 3-5 years. If you are 61 years of age or older, have your blood pressure checked every year. You should have your blood pressure measured twice--once when you are at a hospital or clinic, and once when you are not at a hospital or clinic. Record the average of the two measurements. To check your blood pressure when you are not at a hospital or clinic, you can use:  An automated blood pressure machine at a pharmacy.  A home blood pressure monitor.  If you are between 45 years and 39 years old, ask your health care provider if you should take aspirin to prevent strokes.  Have regular diabetes screenings. This involves taking a blood sample to check your fasting blood sugar level.  If you are at a normal weight and have a low risk for diabetes,  have this test once every three years after 44 years of age.  If you are overweight and have a high risk for diabetes, consider being tested at a younger age or more often. PREVENTING INFECTION  Hepatitis B  If you have a higher risk for hepatitis B, you should be screened for this virus. You are considered at high risk for hepatitis B if:  You were born in a country where hepatitis B is common. Ask your health care provider which countries are considered high risk.  Your parents were born in a high-risk country, and you have not been immunized against hepatitis B (hepatitis B vaccine).  You have HIV or AIDS.  You use needles to inject street drugs.  You live with someone who has hepatitis B.  You have had sex with someone who has hepatitis B.  You get hemodialysis treatment.  You take certain medicines for conditions, including cancer, organ transplantation, and autoimmune conditions. Hepatitis C  Blood testing is recommended for:  Everyone born from [birth year] through 1964.  Anyone with known risk factors for hepatitis C. Sexually transmitted infections (STIs)  You should be screened for sexually transmitted infections (STIs) including gonorrhea and chlamydia if:  You are sexually active and are younger than 44 years of age.  You are older than 44 years of age and your health care provider tells you that you are at risk for this type of infection.  Your sexual activity has changed since you were last screened and you are at an increased risk for chlamydia or gonorrhea. Ask your health care provider if you are at risk.  If you do not have HIV, but are at risk, it may be recommended that you take a prescription medicine daily to prevent HIV infection. This is called pre-exposure prophylaxis (PrEP). You are considered at risk if:  You are sexually active and do not regularly use condoms or know the HIV status of your partner(s).  You take drugs by injection.  You are sexually  active with a partner who has HIV. Talk with your health care provider about whether you are at high risk of being infected with HIV. If you choose to begin PrEP, you should first be tested for HIV. You should then be tested every 3 months for as long as you are taking PrEP.  PREGNANCY   If you are premenopausal and you may become pregnant, ask your health care provider about preconception counseling.  If you may  become pregnant, take 400 to 800 micrograms (mcg) of folic acid every day.  If you want to prevent pregnancy, talk to your health care provider about birth control (contraception). OSTEOPOROSIS AND MENOPAUSE   Osteoporosis is a disease in which the bones lose minerals and strength with aging. This can result in serious bone fractures. Your risk for osteoporosis can be identified using a bone density scan.  If you are 37 years of age or older, or if you are at risk for osteoporosis and fractures, ask your health care provider if you should be screened.  Ask your health care provider whether you should take a calcium or vitamin D supplement to lower your risk for osteoporosis.  Menopause may have certain physical symptoms and risks.  Hormone replacement therapy may reduce some of these symptoms and risks. Talk to your health care provider about whether hormone replacement therapy is right for you.  HOME CARE INSTRUCTIONS   Schedule regular health, dental, and eye exams.  Stay current with your immunizations.   Do not use any tobacco products including cigarettes, chewing tobacco, or electronic cigarettes.  If you are pregnant, do not drink alcohol.  If you are breastfeeding, limit how much and how often you drink alcohol.  Limit alcohol intake to no more than 1 drink per day for nonpregnant women. One drink equals 12 ounces of beer, 5 ounces of wine, or 1 ounces of hard liquor.  Do not use street drugs.  Do not share needles.  Ask your health care provider for help if  you need support or information about quitting drugs.  Tell your health care provider if you often feel depressed.  Tell your health care provider if you have ever been abused or do not feel safe at home.   This information is not intended to replace advice given to you by your health care provider. Make sure you discuss any questions you have with your health care provider.   Document Released: 06/03/2011 Document Revised: 12/09/2014 Document Reviewed: 10/20/2013 Elsevier Interactive Patient Education 2016 [place] [employer] Calories are energy you get from the things you eat and drink. Your body uses this energy to keep you going throughout the day. The number of calories you eat affects your weight. When you eat more calories than your body needs, your body stores the extra calories as fat. When you eat fewer calories than your body needs, your body burns fat to get the energy it needs. Calorie counting means keeping track of how many calories you eat and drink each day. If you make sure to eat fewer calories than your body needs, you should lose weight. In order for calorie counting to work, you will need to eat the number of calories that are right for you in a day to lose a healthy amount of weight per week. A healthy amount of weight to lose per week is usually 1-2 lb (0.5-0.9 kg). A dietitian can determine how many calories you need in a day and give you suggestions on how to reach your calorie goal.  WHAT IS MY MY PLAN? My goal is to have __________ calories per day.  If I have this many calories per day, I should lose around __________ pounds per week. WHAT DO I NEED TO KNOW ABOUT CALORIE COUNTING? In order to meet your daily calorie goal, you will need to:  Find out how many calories are in each food you would like to eat.  Try to do this before you eat.  Decide how much of the food you can eat.  Write down what you ate and how many calories it had.  Doing this is called keeping a food log. WHERE DO I FIND CALORIE INFORMATION? The number of calories in a food can be found on a Nutrition Facts label. Note that all the information on a label is based on a specific serving of the food. If a food does not have a Nutrition Facts label, try to look up the calories online or ask your dietitian for help. HOW DO I DECIDE HOW MUCH TO EAT? To decide how much of the food you can eat, you will need to consider both the number of calories in one serving and the size of one serving. This information can be found on the Nutrition Facts label. If a food does not have a Nutrition Facts label, look up the information online or ask your dietitian for help. Remember that calories are listed per serving. If you choose to have more than one serving of a food, you will have to multiply the calories per serving by the amount of servings you plan to eat. For example, the label on a package of bread might say that a serving size is 1 slice and that there are 90 calories in a serving. If you eat 1 slice, you will have eaten 90 calories. If you eat 2 slices, you will have eaten 180 calories. HOW DO I KEEP A FOOD LOG? After each meal, record the following information in your food log:  What you ate.  How much of it you ate.  How many calories it had.  Then, add up your calories. Keep your food log near you, such as in a small notebook in your pocket. Another option is to use a mobile app or website. Some programs will calculate calories for you and show you how many calories you have left each time you add an item to the log. WHAT ARE SOME CALORIE COUNTING TIPS?  Use your calories on foods and drinks that will fill you up and not leave you hungry. Some examples of this include foods like nuts and nut butters, vegetables, lean proteins, and high-fiber foods (more than 5 g fiber per serving).  Eat nutritious foods and avoid empty calories. Empty calories are calories you  get from foods or beverages that do not have many nutrients, such as candy and soda. It is better to have a nutritious high-calorie food (such as an avocado) than a food with few nutrients (such as a bag of chips).  Know how many calories are in the foods you eat most often. This way, you do not have to look up how many calories they have each time you eat them.  Look out for foods that may seem like low-calorie foods but are really high-calorie foods, such as baked goods, soda, and fat-free candy.  Pay attention to calories in drinks. Drinks such as sodas, specialty coffee drinks, alcohol, and juices have a lot of calories yet do not fill you up. Choose low-calorie drinks like water and diet drinks.  Focus your calorie counting efforts on higher calorie items. Logging the calories in a garden salad that contains only vegetables is less important than calculating the calories in a milk shake.  Find a way of tracking calories that works for you. Get creative. Most people who are successful find ways to keep track of how much they eat in  a day, even if they do not count every calorie. WHAT ARE SOME PORTION CONTROL TIPS?  Know how many calories are in a serving. This will help you know how many servings of a certain food you can have.  Use a measuring cup to measure serving sizes. This is helpful when you start out. With time, you will be able to estimate serving sizes for some foods.  Take some time to put servings of different foods on your favorite plates, bowls, and cups so you know what a serving looks like.  Try not to eat straight from a bag or box. Doing this can lead to overeating. Put the amount you would like to eat in a cup or on a plate to make sure you are eating the right portion.  Use smaller plates, glasses, and bowls to prevent overeating. This is a quick and easy way to practice portion control. If your plate is smaller, less food can fit on it.  Try not to multitask while  eating, such as watching TV or using your computer. If it is time to eat, sit down at a table and enjoy your food. Doing this will help you to start recognizing when you are full. It will also make you more aware of what and how much you are eating. HOW CAN I CALORIE COUNT WHEN EATING OUT?  Ask for smaller portion sizes or child-sized portions.  Consider sharing an entree and sides instead of getting your own entree.  If you get your own entree, eat only half. Ask for a box at the beginning of your meal and put the rest of your entree in it so you are not tempted to eat it.  Look for the calories on the menu. If calories are listed, choose the lower calorie options.  Choose dishes that include vegetables, fruits, whole grains, low-fat dairy products, and lean protein. Focusing on smart food choices from each of the 5 food groups can help you stay on track at restaurants.  Choose items that are boiled, broiled, grilled, or steamed.  Choose water, milk, unsweetened iced tea, or other drinks without added sugars. If you want an alcoholic beverage, choose a lower calorie option. For example, a regular margarita can have up to 700 calories and a glass of wine has around 150.  Stay away from items that are buttered, battered, fried, or served with cream sauce. Items labeled "crispy" are usually fried, unless stated otherwise.  Ask for dressings, sauces, and syrups on the side. These are usually very high in calories, so do not eat much of them.  Watch out for salads. Many people think salads are a healthy option, but this is often not the case. Many salads come with bacon, fried chicken, lots of cheese, fried chips, and dressing. All of these items have a lot of calories. If you want a salad, choose a garden salad and ask for grilled meats or steak. Ask for the dressing on the side, or ask for olive oil and vinegar or lemon to use as dressing.  Estimate how many servings of a food you are given. For  example, a serving of cooked rice is  cup or about the size of half a tennis ball or one cupcake wrapper. Knowing serving sizes will help you be aware of how much food you are eating at restaurants. The list below tells you how big or small some common portion sizes are based on everyday objects.  1 oz--4 stacked dice.  3 oz--1 deck of cards.  1 tsp--1 dice.  1 Tbsp-- a Ping-Pong ball.  2 Tbsp--1 Ping-Pong ball.   cup--1 tennis ball or 1 cupcake wrapper.  1 cup--1 baseball.   This information is not intended to replace advice given to you by your health care provider. Make sure you discuss any questions you have with your health care provider.   Document Released: 11/18/2005 Document Revised: 12/09/2014 Document Reviewed: 09/23/2013 Elsevier Interactive Patient Education 2016 [employer]   Fat and Cholesterol Restricted Diet Getting too much fat and cholesterol in your diet may cause health problems. Following this diet helps keep your fat and cholesterol at normal levels. This can keep you from getting sick. WHAT TYPES OF FAT SHOULD I CHOOSE?  Choose monosaturated and polyunsaturated fats. These are found in foods such as olive oil, canola oil, flaxseeds, walnuts, almonds, and seeds.  Eat more omega-3 fats. Good choices include salmon, mackerel, sardines, tuna, flaxseed oil, and ground flaxseeds.  Limit saturated fats. These are in animal products such as meats, butter, and cream. They can also be in plant products such as palm oil, palm kernel oil, and coconut oil.   Avoid foods with partially hydrogenated oils in them. These contain trans fats. Examples of foods that have trans fats are stick margarine, some tub margarines, cookies, crackers, and other baked goods. WHAT GENERAL GUIDELINES DO I NEED TO FOLLOW?   Check food labels. Look for the words "trans fat" and "saturated fat."  When preparing a meal:  Fill half of your plate with vegetables and green  salads.  Fill one fourth of your plate with whole grains. Look for the word "whole" as the first word in the ingredient list.  Fill one fourth of your plate with lean protein foods.  Limit fruit to two servings a day. Choose fruit instead of juice.  Eat more foods with soluble fiber. Examples of foods with this type of fiber are apples, broccoli, carrots, beans, peas, and barley. Try to get 20-30 g (grams) of fiber per day.  Eat more home-cooked foods. Eat less at restaurants and buffets.  Limit or avoid alcohol.  Limit foods high in starch and sugar.  Limit fried foods.  Cook foods without frying them. Baking, boiling, grilling, and broiling are all great options.  Lose weight if you are overweight. Losing even a small amount of weight can help your overall health. It can also help prevent diseases such as diabetes and heart disease. WHAT FOODS CAN I EAT? Grains Whole grains, such as whole wheat or whole grain breads, crackers, cereals, and pasta. Unsweetened oatmeal, bulgur, barley, quinoa, or brown rice. Corn or whole wheat flour tortillas. Vegetables Fresh or frozen vegetables (raw, steamed, roasted, or grilled). Green salads. Fruits All fresh, canned (in natural juice), or frozen fruits. Meat and Other Protein Products Ground beef (85% or leaner), grass-fed beef, or beef trimmed of fat. Skinless chicken or [place]. Ground chicken or [place]. Pork trimmed of fat. All fish and seafood. Eggs. Dried beans, peas, or lentils. Unsalted nuts or seeds. Unsalted canned or dry beans. Dairy Low-fat dairy products, such as skim or 1% milk, 2% or reduced-fat cheeses, low-fat ricotta or cottage cheese, or plain low-fat yogurt. Fats and Oils Tub margarines without trans fats. Light or reduced-fat mayonnaise and salad dressings. Avocado. Olive, canola, sesame, or safflower oils. Natural peanut or almond butter (choose ones without added sugar and oil). The items listed above may not be a complete  list of recommended foods  or beverages. Contact your dietitian for more options. WHAT FOODS ARE NOT RECOMMENDED? Grains White bread. White pasta. White rice. Cornbread. Bagels, pastries, and croissants. Crackers that contain trans fat. Vegetables White potatoes. Corn. Creamed or fried vegetables. Vegetables in a cheese sauce. Fruits Dried fruits. Canned fruit in light or heavy syrup. Fruit juice. Meat and Other Protein Products Fatty cuts of meat. Ribs, chicken wings, bacon, sausage, bologna, salami, chitterlings, fatback, hot dogs, bratwurst, and packaged luncheon meats. Liver and organ meats. Dairy Whole or 2% milk, cream, half-and-half, and cream cheese. Whole milk cheeses. Whole-fat or sweetened yogurt. Full-fat cheeses. Nondairy creamers and whipped toppings. Processed cheese, cheese spreads, or cheese curds. Sweets and Desserts Corn syrup, sugars, honey, and molasses. Candy. Jam and jelly. Syrup. Sweetened cereals. Cookies, pies, cakes, donuts, muffins, and ice cream. Fats and Oils Butter, stick margarine, lard, shortening, ghee, or bacon fat. Coconut, palm kernel, or palm oils. Beverages Alcohol. Sweetened drinks (such as sodas, lemonade, and fruit drinks or punches). The items listed above may not be a complete list of foods and beverages to avoid. Contact your dietitian for more information.   This information is not intended to replace advice given to you by your health care provider. Make sure you discuss any questions you have with your health care provider.   Document Released: 05/19/2012 Document Revised: 12/09/2014 Document Reviewed: 02/17/2014 Elsevier Interactive Patient Education [employer].

## 2016-09-24 NOTE — Progress Notes (Signed)
Patient: Sarah Monroe, Female    DOB: Feb 05, 1971, 45 y.o.   MRN: [id number] Visit Date: 09/24/2016  Today's Provider: [name], PA-C   Chief Complaint  Patient presents with  . Annual Exam   Subjective:    Annual physical exam Sarah Monroe is a 45 y.o. female who presents today for health maintenance and complete physical. Pt has history significant for morbid obesity, pre-Dm, uterine fibroids, and anemia. Pt goes to [place] for her pap smears and mammograms. Her last mammogram she thinks was five years ago at [facility]. Her last PAP smear was in 2016 and she reports normal.   She feels fairly well.  She does have some ankle swelling occasionally.   She reports exercising some. She reports she is sleeping well. Patient stopped taking Adipex because it made her feel jump. She is using weight watchers techniques and [place] She works as an Retail buyer at [employer]. She denies feeling down or depressed, or anxiety. She has three kids, 42, 57, 8. Reports some bilateral ankle edema with menstrual cycle. LMP 2 weeks ago. She reports she normally has 2 periods a month which her OBGYN told her is likely due to menopause. Mother's age of menopause 79. -----------------------------------------------------------------   Review of Systems  Constitutional: Negative.   HENT: Positive for sneezing.   Eyes: Positive for redness and itching.  Respiratory: Negative.   Cardiovascular: Positive for leg swelling. Negative for chest pain and palpitations.  Gastrointestinal: Negative.   Endocrine: Negative.   Genitourinary: Negative.   Musculoskeletal: Negative.   Skin: Negative.   Allergic/Immunologic: Negative.   Neurological: Negative.   Hematological: Negative.   Psychiatric/Behavioral: Negative.     Social History      She  reports that she has never smoked. She has never used smokeless tobacco. She reports that she drinks  alcohol. She reports that she does not use drugs.       Social History   Social History  . Marital status: Single    Spouse name: N/A  . Number of children: N/A  . Years of education: N/A   Social History Main Topics  . Smoking status: Never Smoker  . Smokeless tobacco: Never Used  . Alcohol use Yes     Comment: occassional  . Drug use: No  . Sexual activity: Yes    Birth control/ protection: None     Comment: Tubal ligation; monogomous relationship with female   Other Topics Concern  . None   Social History Narrative  . None    Past Medical History:  Diagnosis Date  . Anemia   . Anxiety   . Hyperlipidemia      Patient Active Problem List   Diagnosis Date Noted  . Morbidly obese ([place]) 09/29/2015  . BMI 40.0-44.9, adult ([place]) 09/29/2015  . Avitaminosis D 08/23/2015  . Allergic rhinitis 08/22/2015  . Absolute anemia 08/22/2015  . Anxiety 08/22/2015  . Chest pain 08/22/2015  . Borderline diabetes 08/22/2015  . Hypercholesteremia 08/22/2015  . Injury of knee, leg, ankle and foot 08/22/2015  . Puberty bleeding 08/22/2015  . Adiposity 08/22/2015  . Pain in joint, lower leg 08/22/2015  . H/O female genital system disorder 08/22/2015    Past Surgical History:  Procedure Laterality Date  . CESAREAN SECTION     x3  . Endometriotic cyst     Dr. Jamal Collin  . [place]  Dr. Kirtland Bouchard  . Post cholcystectomy  1998  . TUBAL LIGATION Bilateral     Family History        Family Status  Relation Status  . Mother Alive  . Father Alive   Poor health; Internal gastric bleeding 66  . Maternal Grandmother Deceased at age 57   Pancreatic cancer  . Maternal Grandfather Deceased at age 44   COPD  . Paternal Grandmother Deceased at age 23   Coronary artery disease;Myocardial Infarction  . Paternal Grandfather Deceased   died of unknown causes; died before patient was born  . Cousin Alive        Her family history includes Allergies in her mother; Colon  cancer (age of onset: 64) in her cousin; Colon polyps in her mother; Diabetes in her father and mother; Heart murmur in her father; Hernia in her father; Hyperlipidemia in her mother; Hypertension in her father and mother.  Mother had colonoscopy at 55 with polyps, now gets them every five years. Patient reports mother's GI doctor recommended family gets screened.  No Known Allergies  Current Meds  Medication Sig  . Cetirizine HCl (ZYRTEC ALLERGY) 10 MG CAPS Take 10 mg by mouth daily as needed.  . cholecalciferol (VITAMIN D) 1000 UNITS tablet Take 1,000 Units by mouth daily.    Patient Care Team: Mar Daring, PA-C as PCP - General (Physician Assistant)     Objective:   Vitals: BP 122/78 (BP Location: Left Arm, Patient Position: Sitting, Cuff Size: Large)   Pulse 68   Temp 98.6 F (37 C) (Oral)   Resp 16   Ht 5' 1.5" (1.562 m)   Wt 238 lb (108 kg)   LMP 08/25/2016   BMI 44.24 kg/m    Filed Weights   09/24/16 0914  Weight: 238 lb (108 kg)    Physical Exam  Constitutional: She is oriented to person, place, and time. She appears well-nourished. No distress.  HENT:  Head: Normocephalic.  Right Ear: External ear normal.  Left Ear: External ear normal.  Nose: Nose normal.  Mouth/Throat: Oropharynx is clear and moist. No oropharyngeal exudate.  Eyes: EOM are normal. Pupils are equal, round, and reactive to light.  Neck: Normal range of motion. Neck supple. No thyromegaly present.  Cardiovascular: Normal rate and regular rhythm.   Pulmonary/Chest: Effort normal and breath sounds normal. No respiratory distress. She has no wheezes. She has no rales.  Breast exam deferred to [facility]  Abdominal: Soft. Bowel sounds are normal. She exhibits no distension and no mass. There is no tenderness. There is no rebound and no guarding.  Genitourinary:  Genitourinary Comments: Deferred to [facility]  Musculoskeletal: Normal range of motion. She exhibits no edema,  tenderness or deformity.  Neurological: She is alert and oriented to person, place, and time. No cranial nerve deficit.  Skin: Skin is warm and dry. She is not diaphoretic.  Psychiatric: She has a normal mood and affect. Her behavior is normal.  Vitals reviewed.    Depression Screen PHQ 2/9 Scores 09/24/2016  PHQ - 2 Score 0      Assessment & Plan:     Routine Health Maintenance and Physical Exam  Exercise Activities and Dietary recommendations Goals    . Exercise 150 minutes per week (moderate activity)       Immunization History  Administered Date(s) Administered  . Influenza,inj,Quad PF,36+ Mos 08/23/2015  . Td 01/16/2005  . Tdap 12/31/2013    Health Maintenance  Topic Date Due  .  INFLUENZA VACCINE  07/02/2016  . PAP SMEAR  05/02/2018  . TETANUS/TDAP  01/01/2024  . HIV Screening  Addressed    Discussed health benefits of physical activity, and encouraged her to engage in regular exercise appropriate for her age and condition.   Problem List Items Addressed This Visit    Borderline diabetes - Primary   Relevant Orders   Comprehensive metabolic panel   Hemoglobin A1c   Hypercholesteremia   Avitaminosis D   Relevant Orders   VITAMIN D 25 Hydroxy (Vit-D Deficiency, Fractures)   BMI 40.0-44.9, adult (HCC)   Relevant Orders   TSH    Other Visit Diagnoses    Family hx of colon cancer       Relevant Orders   Ambulatory referral to Gastroenterology   Hx of iron deficiency anemia       Relevant Orders   CBC with Differential/Platelet   Breast cancer screening       Body mass index (BMI) of 40.1-44.9 in adult [facility])         Evaluate and treat as above. Encouraged patient to keep up with lifestyle modifications. BMI is still high, but patient has made strides with her eating and exercise habits. Filled out [employer] forms. Patients [facility] care performed [facility], breast care at [facility]. Will contact patient about labwork. Referred patient to GI  for consult 2/2 family hx of colon cancer in cousin and polyps in mother. [name] has mammogram order in from last visit that patient may schedule.   I have spent 25 minutes with this patient, >50% of which was spent on counseling and coordination of care.   --------------------------------------------------------------------    [name], PA-C  [place]

## 2016-09-24 NOTE — Telephone Encounter (Signed)
-----   Message from Sandie Ano sent at 09/24/2016  4:30 PM EDT ----- Dennis Bast seen this patient this morning for an annual physical.  However, the charges are not reflecting this.   It only shows an office visit code.  Do you want to charge her for a physical?  Arbie Cookey

## 2016-09-24 NOTE — Telephone Encounter (Signed)
Yes, I did want to charge her for a physical, all the options I see are for established/new office visits? Is there another way to go about this? Thank you!

## 2016-09-25 ENCOUNTER — Other Ambulatory Visit: Payer: Self-pay | Admitting: Physician Assistant

## 2016-09-25 ENCOUNTER — Other Ambulatory Visit: Payer: Self-pay

## 2016-09-25 DIAGNOSIS — D509 Iron deficiency anemia, unspecified: Secondary | ICD-10-CM

## 2016-09-25 LAB — COMPREHENSIVE METABOLIC PANEL
ALT: 13 IU/L (ref 0–32)
AST: 13 IU/L (ref 0–40)
Albumin/Globulin Ratio: 1.3 (ref 1.2–2.2)
Albumin: 4 g/dL (ref 3.5–5.5)
Alkaline Phosphatase: 62 IU/L (ref 39–117)
BUN/Creatinine Ratio: 13 (ref 9–23)
BUN: 9 mg/dL (ref 6–24)
Bilirubin Total: 0.3 mg/dL (ref 0.0–1.2)
CO2: 25 mmol/L (ref 18–29)
Calcium: 9.6 mg/dL (ref 8.7–10.2)
Chloride: 103 mmol/L (ref 96–106)
Creatinine, Ser: 0.71 mg/dL (ref 0.57–1.00)
GFR calc Af Amer: 120 mL/min/{1.73_m2} (ref >59)
GFR calc non Af Amer: 104 mL/min/{1.73_m2} (ref >59)
Globulin, Total: 3 g/dL (ref 1.5–4.5)
Glucose: 90 mg/dL (ref 65–99)
Potassium: 4.9 mmol/L (ref 3.5–5.2)
Sodium: 142 mmol/L (ref 134–144)
Total Protein: 7 g/dL (ref 6.0–8.5)

## 2016-09-25 LAB — CBC WITH DIFFERENTIAL/PLATELET
Basophils Absolute: 0 10*3/uL (ref 0.0–0.2)
Basos: 1 %
EOS (ABSOLUTE): 0.1 10*3/uL (ref 0.0–0.4)
Eos: 4 %
Hematocrit: 33 % — ABNORMAL LOW (ref 34.0–46.6)
Hemoglobin: 10.1 g/dL — ABNORMAL LOW (ref 11.1–15.9)
Immature Grans (Abs): 0 10*3/uL (ref 0.0–0.1)
Immature Granulocytes: 0 %
Lymphocytes Absolute: 1.3 10*3/uL (ref 0.7–3.1)
Lymphs: 32 %
MCH: 21.7 pg — ABNORMAL LOW (ref 26.6–33.0)
MCHC: 30.6 g/dL — ABNORMAL LOW (ref 31.5–35.7)
MCV: 71 fL — ABNORMAL LOW (ref 79–97)
Monocytes Absolute: 0.5 10*3/uL (ref 0.1–0.9)
Monocytes: 11 %
Neutrophils Absolute: 2.1 10*3/uL (ref 1.4–7.0)
Neutrophils: 52 %
Platelets: 334 10*3/uL (ref 150–379)
RBC: 4.65 x10E6/uL (ref 3.77–5.28)
RDW: 17.3 % — ABNORMAL HIGH (ref 12.3–15.4)
WBC: 4 10*3/uL (ref 3.4–10.8)

## 2016-09-25 LAB — TSH: TSH: 1.02 u[IU]/mL (ref 0.450–4.500)

## 2016-09-25 LAB — VITAMIN D 25 HYDROXY (VIT D DEFICIENCY, FRACTURES): Vit D, 25-Hydroxy: 18.5 ng/mL — ABNORMAL LOW (ref 30.0–100.0)

## 2016-09-25 LAB — HEMOGLOBIN A1C
Est. average glucose Bld gHb Est-mCnc: 123 mg/dL
Hgb A1c MFr Bld: 5.9 % — ABNORMAL HIGH (ref 4.8–5.6)

## 2016-09-25 NOTE — Progress Notes (Signed)
Patient's lab work came back. CBC showed drop in hemoglobin compared to last year and values consistent with iron deficiency anemia. Patient has uterine fibroids and multiple menstrual cycles per month, she sees [place] for this. Anemia likely 2/2 fibroids, but would like iron panel. Have ordered this today. Will advise patient if she needs supplement, would like cbc in 3 months if supplementation is started.  Vitamin D lower than last year. Pt should take 50,000 IU Vit D once weekly for 8 weeks followed by 1,000 IU daily until next physical.   A1C improved, keep up good work with lifestyle changes.   TSH normal. CMP normal.

## 2016-09-25 NOTE — Telephone Encounter (Signed)
-----   Message from [name], [place] sent at 09/25/2016  9:27 AM EDT ----- Patient's lab work came back. CBC showed drop in hemoglobin compared to last year and values consistent with iron deficiency anemia. Patient has uterine fibroids and multiple menstrual cycles per month, she sees [place] for this. Anemia likely 2/2 fibroids, but would like iron panel. Have ordered this today. Will advise patient if she needs iron supplement, would like cbc in 3 months if supplementation is started.  Vitamin D lower than last year. Pt should take 50,000 IU Vit D once weekly for 8 weeks followed by 1,000 IU daily until next physical.   A1C improved, keep up good work with lifestyle changes.   TSH normal. CMP normal.

## 2016-09-25 NOTE — Telephone Encounter (Signed)
Pt advised. Pt is going to pick up the lab sheet. Please send Vitamin D to [facility].    Thanks,   -Mickel Baas

## 2016-09-26 MED ORDER — VITAMIN D (ERGOCALCIFEROL) 1.25 MG (50000 UNIT) PO CAPS: 50000.0000 [IU] | ORAL_CAPSULE | ORAL | 0 refills | Status: DC

## 2016-09-27 ENCOUNTER — Other Ambulatory Visit: Payer: Self-pay | Admitting: Physician Assistant

## 2016-09-27 DIAGNOSIS — Z1231 Encounter for screening mammogram for malignant neoplasm of breast: Secondary | ICD-10-CM

## 2016-09-30 ENCOUNTER — Other Ambulatory Visit: Payer: Self-pay | Admitting: Physician Assistant

## 2016-10-01 ENCOUNTER — Telehealth: Payer: Self-pay

## 2016-10-01 ENCOUNTER — Other Ambulatory Visit: Payer: Self-pay | Admitting: Physician Assistant

## 2016-10-01 DIAGNOSIS — D509 Iron deficiency anemia, unspecified: Secondary | ICD-10-CM

## 2016-10-01 LAB — CBC
Hematocrit: 34 % (ref 34.0–46.6)
Hemoglobin: 10.2 g/dL — ABNORMAL LOW (ref 11.1–15.9)
MCH: 21.5 pg — ABNORMAL LOW (ref 26.6–33.0)
MCHC: 30 g/dL — ABNORMAL LOW (ref 31.5–35.7)
MCV: 72 fL — ABNORMAL LOW (ref 79–97)
Platelets: 312 10*3/uL (ref 150–379)
RBC: 4.75 x10E6/uL (ref 3.77–5.28)
RDW: 16.6 % — ABNORMAL HIGH (ref 12.3–15.4)
WBC: 4.1 10*3/uL (ref 3.4–10.8)

## 2016-10-01 LAB — IRON: Iron: 41 ug/dL (ref 27–159)

## 2016-10-01 LAB — FERRITIN: Ferritin: 6 ng/mL — ABNORMAL LOW (ref 15–150)

## 2016-10-01 LAB — TRANSFERRIN: Transferrin: 342 mg/dL (ref 200–370)

## 2016-10-01 NOTE — Telephone Encounter (Signed)
Pt advised.   Thanks,   -Laura  

## 2016-10-01 NOTE — Telephone Encounter (Signed)
-----   Message from [name], [place] sent at 10/01/2016 11:49 AM EDT ----- Labs do indicate iron deficiency anemia. Patient has known uterine fibroids followed by gynecology. Recommend patient start 325 mg ferrous sulfate three times a day and get CBC in 3 mo to assess anemia.

## 2016-10-04 ENCOUNTER — Other Ambulatory Visit: Payer: Self-pay

## 2016-10-04 ENCOUNTER — Telehealth: Payer: Self-pay | Admitting: Gastroenterology

## 2016-10-04 NOTE — Telephone Encounter (Signed)
Screening Colonoscopy Z12.11 [facility] 10/18/2016 Dr. Vicente Males  [facility]  Pre cert

## 2016-10-04 NOTE — Telephone Encounter (Signed)
Gastroenterology Pre-Procedure Review  Request Date: 10/18/2016 Requesting Physician: Dr. Marlyn Corporal  PATIENT REVIEW QUESTIONS: The patient responded to the following health history questions as indicated:    1. Are you having any GI issues? yes (constipation ) 2. Do you have a personal history of Polyps? no 3. Do you have a family history of Colon Cancer or Polyps? yes (mother, colon polyps ) 4. Diabetes Mellitus? no 5. Joint replacements in the past 12 months?no 6. Major health problems in the past 3 months?no 7. Any artificial heart valves, MVP, or defibrillator?no    MEDICATIONS & ALLERGIES:    Patient reports the following regarding taking any anticoagulation/antiplatelet therapy:   Plavix, Coumadin, Eliquis, Xarelto, Lovenox, Pradaxa, Brilinta, or Effient? no Aspirin? no  Patient confirms/reports the following medications:  Current Outpatient Prescriptions  Medication Sig Dispense Refill  . Cetirizine HCl (ZYRTEC ALLERGY) 10 MG CAPS Take 10 mg by mouth daily as needed.    . ferrous sulfate 325 (65 FE) MG EC tablet Take 325 mg by mouth 3 (three) times daily with meals.    . Vitamin D, Ergocalciferol, (DRISDOL) 50000 units CAPS capsule Take 1 capsule (50,000 Units total) by mouth every 7 (seven) days. 8 capsule 0  . cholecalciferol (VITAMIN D) 1000 units tablet Take 1,000 Units by mouth daily.     No current facility-administered medications for this visit.     Patient confirms/reports the following allergies:  No Known Allergies  No orders of the defined types were placed in this encounter.   AUTHORIZATION INFORMATION Primary Insurance: 1D#: Group #:  Secondary Insurance: 1D#: Group #:  SCHEDULE INFORMATION: Date: 10/18/16 Time: Location: [facility]

## 2016-10-04 NOTE — Telephone Encounter (Signed)
Patient is returning a call to schedule appointment for colonoscopy

## 2016-10-07 ENCOUNTER — Telehealth: Payer: Self-pay

## 2016-10-07 NOTE — Telephone Encounter (Signed)
Okay. Commentary on lab results states she was to have CBC in three months to follow iron deficiency, not one week. Not sure where patient got this information from. Would still like CBC in 3 months, which she should be aware of.

## 2016-10-07 NOTE — Telephone Encounter (Signed)
[id number] 

## 2016-10-07 NOTE — Telephone Encounter (Signed)
The notification/prior authorization case information was transmitted on 10/07/2016 at 10:19 AM CST. The notification/prior authorization reference number is [id number]. Please print this page for your records.

## 2016-10-07 NOTE — Telephone Encounter (Signed)
-----   Message from [name], [place] sent at 10/07/2016  8:19 AM EST ----- Results similar to first lab draw. Unsure why patient got bloodwork again so soon when monitoring for anemia was to take place at later point. Please advise patient.

## 2016-10-07 NOTE — Telephone Encounter (Signed)
Patient has been advised. KW 

## 2016-10-07 NOTE — Telephone Encounter (Signed)
Patient states that she initially had labs done after physical on 10/24 she states that she received results from that lab that she was anemic she states she was instructed to return back to the lab to get labs redrawn again a week later. KW

## 2016-10-18 ENCOUNTER — Ambulatory Visit: Payer: 59 | Admitting: Anesthesiology

## 2016-10-18 ENCOUNTER — Encounter: Payer: Self-pay | Admitting: *Deleted

## 2016-10-18 ENCOUNTER — Encounter
Admission: RE | Disposition: A | Discharge status: Home / Self Care | Payer: Self-pay | Source: Ambulatory Visit | Attending: Gastroenterology

## 2016-10-18 ENCOUNTER — Ambulatory Visit
Admission: RE | Admit: 2016-10-18 | Discharge: 2016-10-18 | Disposition: A | Discharge status: Home / Self Care | Payer: 59 | Source: Ambulatory Visit | Attending: Gastroenterology | Admitting: Gastroenterology

## 2016-10-18 DIAGNOSIS — D649 Anemia, unspecified: Secondary | ICD-10-CM | POA: Insufficient documentation

## 2016-10-18 DIAGNOSIS — Z79899 Other long term (current) drug therapy: Secondary | ICD-10-CM | POA: Diagnosis not present

## 2016-10-18 DIAGNOSIS — Z8371 Family history of colonic polyps: Secondary | ICD-10-CM | POA: Diagnosis not present

## 2016-10-18 DIAGNOSIS — E785 Hyperlipidemia, unspecified: Secondary | ICD-10-CM | POA: Insufficient documentation

## 2016-10-18 DIAGNOSIS — Z1211 Encounter for screening for malignant neoplasm of colon: Secondary | ICD-10-CM | POA: Diagnosis not present

## 2016-10-18 DIAGNOSIS — F419 Anxiety disorder, unspecified: Secondary | ICD-10-CM | POA: Diagnosis not present

## 2016-10-18 HISTORY — PX: COLONOSCOPY WITH PROPOFOL: SHX5780

## 2016-10-18 LAB — POCT PREGNANCY, URINE: PREG TEST UR: NEGATIVE

## 2016-10-18 LAB — GLUCOSE, CAPILLARY: Glucose-Capillary: 94 mg/dL (ref 65–99)

## 2016-10-18 SURGERY — COLONOSCOPY WITH PROPOFOL
Anesthesia: General

## 2016-10-18 MED ORDER — PROPOFOL 500 MG/50ML IV EMUL
INTRAVENOUS | Status: DC | PRN
  Administered 2016-10-18: 150 ug/kg/min via INTRAVENOUS

## 2016-10-18 MED ORDER — MIDAZOLAM HCL 2 MG/2ML IJ SOLN
INTRAMUSCULAR | Status: DC | PRN
  Administered 2016-10-18: 1 mg via INTRAVENOUS

## 2016-10-18 MED ORDER — PROPOFOL 10 MG/ML IV BOLUS
INTRAVENOUS | Status: DC | PRN
  Administered 2016-10-18: 70 mg via INTRAVENOUS

## 2016-10-18 MED ORDER — FENTANYL CITRATE (PF) 100 MCG/2ML IJ SOLN
INTRAMUSCULAR | Status: DC | PRN
  Administered 2016-10-18: 50 ug via INTRAVENOUS

## 2016-10-18 MED ORDER — SODIUM CHLORIDE 0.9 % IV SOLN
INTRAVENOUS | Status: DC
  Administered 2016-10-18: 10:00:00 via INTRAVENOUS
  Administered 2016-10-18: 1000 mL via INTRAVENOUS

## 2016-10-18 NOTE — Anesthesia Preprocedure Evaluation (Signed)
Anesthesia Evaluation  Patient identified by MRN, date of birth, ID band Patient awake    Reviewed: Allergy & Precautions, NPO status , Patient's Chart, lab work & pertinent test results  History of Anesthesia Complications (+) history of anesthetic complications (Pt states that she had to have a breathing tube and was given lasix post-op. Probable negative pressure pulmonary edema)  Airway Mallampati: II       Dental   Pulmonary neg pulmonary ROS,           Cardiovascular negative cardio ROS       Neuro/Psych negative neurological ROS     GI/Hepatic negative GI ROS, Neg liver ROS,   Endo/Other  negative endocrine ROS  Renal/GU negative Renal ROS     Musculoskeletal   Abdominal   Peds  Hematology  (+) anemia ,   Anesthesia Other Findings   Reproductive/Obstetrics                             Anesthesia Physical Anesthesia Plan  ASA: III  Anesthesia Plan: General   Post-op Pain Management:    Induction: Intravenous  Airway Management Planned: Nasal Cannula  Additional Equipment:   Intra-op Plan:   Post-operative Plan:   Informed Consent: I have reviewed the patients History and Physical, chart, labs and discussed the procedure including the risks, benefits and alternatives for the proposed anesthesia with the patient or authorized representative who has indicated his/her understanding and acceptance.     Plan Discussed with:   Anesthesia Plan Comments:         Anesthesia Quick Evaluation

## 2016-10-18 NOTE — Op Note (Signed)
[facility] Gastroenterology Patient Name: Sarah Monroe Procedure Date: 10/18/2016 9:45 AM MRN: [id number] Account #: [id number] Date of Birth: 06/22/1971 Admit Type: Outpatient Age: 45 Room: [facility] ENDO ROOM 4 Gender: Female Note Status: Finalized Procedure:            Colonoscopy Indications:          Screening for colorectal malignant neoplasm, Family                        history of colonic polyps in a first-degree relative Providers:            Jonathon Bellows MD, MD Referring MD:         Mar Daring (Referring MD) Medicines:            Monitored Anesthesia Care Complications:        No immediate complications. Procedure:            Pre-Anesthesia Assessment:                       - Prior to the procedure, a History and Physical was                        performed, and patient medications, allergies and                        sensitivities were reviewed. The patient's tolerance of                        previous anesthesia was reviewed.                       - The risks and benefits of the procedure and the                        sedation options and risks were discussed with the                        patient. All questions were answered and informed                        consent was obtained.                       - ASA Grade Assessment: III - A patient with severe                        systemic disease.                       After obtaining informed consent, the colonoscope was                        passed under direct vision. Throughout the procedure,                        the patient's blood pressure, pulse, and oxygen                        saturations were monitored continuously. The  Colonoscope was introduced through the anus and                        advanced to the the cecum, identified by the                        appendiceal orifice, IC valve and transillumination.                        The colonoscopy was  performed with ease. The patient                        tolerated the procedure well. The quality of the bowel                        preparation was excellent. Findings:      Non-bleeding internal hemorrhoids were found [Method Found]. The       hemorrhoids were medium-sized and Grade I (internal hemorrhoids that do       not prolapse). Impression:           - No specimens collected. Recommendation:       - Patient has a contact number available for                        emergencies. The signs and symptoms of potential                        delayed complications were discussed with the patient.                        Return to normal activities tomorrow. Written discharge                        instructions were provided to the patient.                       - Resume previous diet.                       - Continue present medications.                       - Repeat colonoscopy in 5 years for surveillance. Procedure Code(s):    --- Professional ---                       [id number], Colorectal cancer screening; colonoscopy on                        individual not meeting criteria for high risk Diagnosis Code(s):    --- Professional ---                       Z83.71, Family history of colonic polyps                       Z12.11, Encounter for screening for malignant neoplasm                        of colon CPT copyright 2016 American Medical Association. All rights reserved. The codes documented in this report are preliminary and  upon coder review may  be revised to meet current compliance requirements. Jonathon Bellows, MD Jonathon Bellows MD, MD 10/18/2016 10:13:24 AM This report has been signed electronically. Number of Addenda: 0 Note Initiated On: 10/18/2016 9:45 AM Scope Withdrawal Time: 0 hours 12 minutes 17 seconds  Total Procedure Duration: 0 hours 14 minutes 25 seconds       [facility]

## 2016-10-18 NOTE — Anesthesia Procedure Notes (Signed)
Date/Time: 10/18/2016 10:02 AM Performed by: Nelda Marseille Pre-anesthesia Checklist: Patient identified, Emergency Drugs available, Suction available, Patient being monitored and Timeout performed Oxygen Delivery Method: Nasal cannula

## 2016-10-18 NOTE — H&P (Signed)
Jonathon Bellows MD [address] [place] [address], [place] [address] Phone: [phone] Fax : [phone]  Primary Care Physician:  Mar Daring, PA-C Primary Gastroenterologist:  Dr. Jonathon Bellows   Pre-Procedure History & Physical: HPI:  Sarah Monroe is a 45 y.o. female is here for an colonoscopy.   Past Medical History:  Diagnosis Date  . Anemia   . Anxiety   . Hyperlipidemia     Past Surgical History:  Procedure Laterality Date  . CESAREAN SECTION     x3  . Endometriotic cyst     Dr. Jamal Collin  . GALLBLADDER SURGERY  1999   Dr. Kirtland Bouchard  . Post cholcystectomy  1998  . TUBAL LIGATION Bilateral     Prior to Admission medications   Medication Sig Start Date End Date Taking? Authorizing Provider  Cetirizine HCl (ZYRTEC ALLERGY) 10 MG CAPS Take 10 mg by mouth daily as needed. 01/31/11  Yes Historical Provider, MD  cholecalciferol (VITAMIN D) 1000 units tablet Take 1,000 Units by mouth daily.   Yes Historical Provider, MD  ferrous sulfate 325 (65 FE) MG EC tablet Take 325 mg by mouth 3 (three) times daily with meals.   Yes Historical Provider, MD  Vitamin D, Ergocalciferol, (DRISDOL) 50000 units CAPS capsule Take 1 capsule (50,000 Units total) by mouth every 7 (seven) days. 09/26/16  Yes [name], PA-C    Allergies as of 10/04/2016  . (No Known Allergies)    Family History  Problem Relation Age of Onset  . Allergies Mother   . Diabetes Mother     History of DM  . Hyperlipidemia Mother   . Hypertension Mother     But is well controlled due to diet/exercise  . Colon polyps Mother   . Hypertension Father   . Diabetes Father     Type 2  . Hernia Father   . Heart murmur Father   . Colon cancer Cousin 22    Social History   Social History  . Marital status: Single    Spouse name: N/A  . Number of children: N/A  . Years of education: N/A   Occupational History  . Not on file.   Social History Main Topics  . Smoking status: Never Smoker  .  Smokeless tobacco: Never Used  . Alcohol use Yes     Comment: occassional  . Drug use: No  . Sexual activity: Yes    Birth control/ protection: None     Comment: Tubal ligation; monogomous relationship with female   Other Topics Concern  . Not on file   Social History Narrative  . No narrative on file    Review of Systems: See HPI, otherwise negative ROS  Physical Exam: BP 131/79   Temp (!) 96.9 F (36.1 C) (Tympanic)   Resp 17   Ht 5\' 1"  (1.549 m)   Wt 230 lb (104.3 kg)   LMP 08/25/2016   SpO2 99%   BMI 43.46 kg/m  General:   Alert,  pleasant and cooperative in NAD Head:  Normocephalic and atraumatic. Neck:  Supple; no masses or thyromegaly. Lungs:  Clear throughout to auscultation.    Heart:  Regular rate and rhythm. Abdomen:  Soft, nontender and nondistended. Normal bowel sounds, without guarding, and without rebound.   Neurologic:  Alert and  oriented x4;  grossly normal neurologically.  Impression/Plan: Sarah Monroe is here for an colonoscopy to be performed for colorectal cancer screening  Risks, benefits, limitations, and alternatives regarding  colonoscopy  have been reviewed with the patient.  Questions have been answered.  All parties agreeable.   Jonathon Bellows, MD  10/18/2016, 9:15 AM

## 2016-10-18 NOTE — Anesthesia Postprocedure Evaluation (Signed)
Anesthesia Post Note  Patient: Sarah Monroe  Procedure(s) Performed: Procedure(s) (LRB): COLONOSCOPY WITH PROPOFOL (N/A)  Patient location during evaluation: Endoscopy Anesthesia Type: General Level of consciousness: awake and alert Pain management: pain level controlled Vital Signs Assessment: post-procedure vital signs reviewed and stable Respiratory status: spontaneous breathing and respiratory function stable Cardiovascular status: stable Anesthetic complications: no    Last Vitals:  Vitals:   10/18/16 0911 10/18/16 1016  BP: 131/79 114/66  Pulse:  89  Resp: 17 (!) 24  Temp: (!) 36.1 C (!) 36 C    Last Pain:  Vitals:   10/18/16 1016  TempSrc: Tympanic                 KEPHART,WILLIAM K

## 2016-10-18 NOTE — Transfer of Care (Signed)
Immediate Anesthesia Transfer of Care Note  Patient: Sarah Monroe  Procedure(s) Performed: Procedure(s): COLONOSCOPY WITH PROPOFOL (N/A)  Patient Location: PACU  Anesthesia Type:General  Level of Consciousness: sedated  Airway & Oxygen Therapy: Patient Spontanous Breathing and Patient connected to nasal cannula oxygen  Post-op Assessment: Report given to RN and Post -op Vital signs reviewed and stable  Post vital signs: Reviewed and stable  Last Vitals:  Vitals:   10/18/16 0911  BP: 131/79  Resp: 17  Temp: (!) 36.1 C    Last Pain:  Vitals:   10/18/16 0911  TempSrc: Tympanic         Complications: No apparent anesthesia complications

## 2016-10-21 ENCOUNTER — Encounter: Payer: Self-pay | Admitting: Gastroenterology

## 2016-10-23 ENCOUNTER — Ambulatory Visit: Payer: Self-pay | Attending: Physician Assistant

## 2016-10-28 ENCOUNTER — Ambulatory Visit
Admission: RE | Admit: 2016-10-28 | Discharge: 2016-10-28 | Disposition: A | Discharge status: Home / Self Care | Payer: 59 | Source: Ambulatory Visit | Attending: Physician Assistant | Admitting: Physician Assistant

## 2016-10-28 DIAGNOSIS — Z1231 Encounter for screening mammogram for malignant neoplasm of breast: Secondary | ICD-10-CM | POA: Insufficient documentation

## 2016-10-29 ENCOUNTER — Telehealth: Payer: Self-pay

## 2016-10-29 NOTE — Telephone Encounter (Signed)
-----   Message from [name], [place] sent at 10/28/2016  4:27 PM EST ----- Normal mammogram.

## 2016-10-29 NOTE — Telephone Encounter (Signed)
Pt advised.   Thanks,   -Laura  

## 2017-01-03 DIAGNOSIS — N760 Acute vaginitis: Secondary | ICD-10-CM | POA: Diagnosis not present

## 2017-01-21 DIAGNOSIS — B370 Candidal stomatitis: Secondary | ICD-10-CM | POA: Diagnosis not present

## 2017-01-28 ENCOUNTER — Emergency Department
Admission: EM | Admit: 2017-01-28 | Discharge: 2017-01-28 | Disposition: A | Discharge status: Home / Self Care | Payer: 59 | Attending: Emergency Medicine | Admitting: Emergency Medicine

## 2017-01-28 ENCOUNTER — Emergency Department: Payer: 59

## 2017-01-28 ENCOUNTER — Encounter: Payer: Self-pay | Admitting: *Deleted

## 2017-01-28 DIAGNOSIS — Z79899 Other long term (current) drug therapy: Secondary | ICD-10-CM | POA: Insufficient documentation

## 2017-01-28 DIAGNOSIS — R1110 Vomiting, unspecified: Secondary | ICD-10-CM | POA: Diagnosis not present

## 2017-01-28 DIAGNOSIS — K449 Diaphragmatic hernia without obstruction or gangrene: Secondary | ICD-10-CM | POA: Diagnosis not present

## 2017-01-28 DIAGNOSIS — K859 Acute pancreatitis without necrosis or infection, unspecified: Secondary | ICD-10-CM | POA: Insufficient documentation

## 2017-01-28 DIAGNOSIS — R1010 Upper abdominal pain, unspecified: Secondary | ICD-10-CM | POA: Diagnosis present

## 2017-01-28 LAB — COMPREHENSIVE METABOLIC PANEL
ALBUMIN: 4 g/dL (ref 3.5–5.0)
ALT: 21 U/L (ref 14–54)
ANION GAP: 8 (ref 5–15)
AST: 21 U/L (ref 15–41)
Alkaline Phosphatase: 57 U/L (ref 38–126)
BILIRUBIN TOTAL: 0.6 mg/dL (ref 0.3–1.2)
BUN: 8 mg/dL (ref 6–20)
CHLORIDE: 104 mmol/L (ref 101–111)
CO2: 25 mmol/L (ref 22–32)
Calcium: 9.4 mg/dL (ref 8.9–10.3)
Creatinine, Ser: 0.74 mg/dL (ref 0.44–1.00)
GFR calc Af Amer: >60 mL/min (ref >60)
GFR calc non Af Amer: >60 mL/min (ref >60)
GLUCOSE: 112 mg/dL — AB (ref 65–99)
POTASSIUM: 3.8 mmol/L (ref 3.5–5.1)
SODIUM: 137 mmol/L (ref 135–145)
TOTAL PROTEIN: 8.2 g/dL — AB (ref 6.5–8.1)

## 2017-01-28 LAB — URINALYSIS, COMPLETE (UACMP) WITH MICROSCOPIC
BACTERIA UA: NONE SEEN
BILIRUBIN URINE: NEGATIVE
Glucose, UA: NEGATIVE mg/dL
Ketones, ur: 5 mg/dL — AB
Leukocytes, UA: NEGATIVE
NITRITE: NEGATIVE
PROTEIN: NEGATIVE mg/dL
Specific Gravity, Urine: 1.008 (ref 1.005–1.030)
pH: 6 (ref 5.0–8.0)

## 2017-01-28 LAB — CBC
HEMATOCRIT: 33.4 % — AB (ref 35.0–47.0)
HEMOGLOBIN: 11 g/dL — AB (ref 12.0–16.0)
MCH: 23.9 pg — ABNORMAL LOW (ref 26.0–34.0)
MCHC: 33 g/dL (ref 32.0–36.0)
MCV: 72.4 fL — AB (ref 80.0–100.0)
Platelets: 329 10*3/uL (ref 150–440)
RBC: 4.61 MIL/uL (ref 3.80–5.20)
RDW: 17.5 % — ABNORMAL HIGH (ref 11.5–14.5)
WBC: 6.9 10*3/uL (ref 3.6–11.0)

## 2017-01-28 LAB — POCT PREGNANCY, URINE: Preg Test, Ur: NEGATIVE

## 2017-01-28 LAB — LIPASE, BLOOD: LIPASE: 73 U/L — AB (ref 11–51)

## 2017-01-28 MED ORDER — IOPAMIDOL (ISOVUE-300) INJECTION 61%
100.0000 mL | Freq: Once | INTRAVENOUS | Status: AC | PRN
  Administered 2017-01-28: 100 mL via INTRAVENOUS

## 2017-01-28 MED ORDER — PANTOPRAZOLE SODIUM 40 MG PO TBEC: 40.0000 mg | DELAYED_RELEASE_TABLET | Freq: Every day | ORAL | 0 refills | Status: DC

## 2017-01-28 MED ORDER — MORPHINE SULFATE (PF) 4 MG/ML IV SOLN
4.0000 mg | Freq: Once | INTRAVENOUS | Status: AC
  Administered 2017-01-28: 4 mg via INTRAVENOUS
  Filled 2017-01-28: qty 1

## 2017-01-28 MED ORDER — IOPAMIDOL (ISOVUE-300) INJECTION 61%
30.0000 mL | Freq: Once | INTRAVENOUS | Status: AC | PRN
  Administered 2017-01-28: 30 mL via ORAL

## 2017-01-28 MED ORDER — ONDANSETRON HCL 4 MG/2ML IJ SOLN
4.0000 mg | Freq: Once | INTRAMUSCULAR | Status: AC
  Administered 2017-01-28: 4 mg via INTRAVENOUS
  Filled 2017-01-28: qty 2

## 2017-01-28 NOTE — ED Triage Notes (Signed)
Pt reports upper abd pain for 4 days.  Today pt vomiting green bile.  No diarrhea.  No back pain.  Pt alert.

## 2017-01-28 NOTE — ED Notes (Signed)
Dr. Owens Shark in room to discuss plan of care.  Will continue to monitor.

## 2017-01-28 NOTE — ED Provider Notes (Signed)
[facility] Emergency Department Provider Note   First MD Initiated Contact with Patient 01/28/17 0451     (approximate)  I have reviewed the triage vital signs and the nursing notes.   HISTORY  Chief Complaint Abdominal Pain   HPI Sarah Monroe is a 46 y.o. female with below list of chronic medical conditions including cholecystectomy secondary cholelithiasis presents to the emergency department with upper abdominal pain has progressively worsened over the past 3 days. Patient states her current pain score is 9 out of 10. Patient also admits to vomiting however no diarrhea or constipation. Patient denies any fever or febrile on presentation   Past Medical History:  Diagnosis Date  . Anemia   . Anxiety   . Hyperlipidemia     Patient Active Problem List   Diagnosis Date Noted  . Morbidly obese ([place]) 09/29/2015  . BMI 40.0-44.9, adult ([place]) 09/29/2015  . Avitaminosis D 08/23/2015  . Allergic rhinitis 08/22/2015  . Absolute anemia 08/22/2015  . Anxiety 08/22/2015  . Chest pain 08/22/2015  . Borderline diabetes 08/22/2015  . Hypercholesteremia 08/22/2015  . Injury of knee, leg, ankle and foot 08/22/2015  . Puberty bleeding 08/22/2015  . Adiposity 08/22/2015  . Pain in joint, lower leg 08/22/2015  . H/O female genital system disorder 08/22/2015    Past Surgical History:  Procedure Laterality Date  . CESAREAN SECTION     x3  . COLONOSCOPY WITH PROPOFOL N/A 10/18/2016   Procedure: COLONOSCOPY WITH PROPOFOL;  Surgeon: Jonathon Bellows, MD;  Location: [facility] ENDOSCOPY;  Service: Endoscopy;  Laterality: N/A;  . Endometriotic cyst     Dr. Jamal Collin  . GALLBLADDER SURGERY  1999   Dr. Kirtland Bouchard  . Post cholcystectomy  1998  . TUBAL LIGATION Bilateral     Prior to Admission medications   Medication Sig Start Date End Date Taking? Authorizing Provider  Cetirizine HCl (ZYRTEC ALLERGY) 10 MG CAPS Take 10 mg by mouth daily as needed. 01/31/11   Historical  Provider, MD  cholecalciferol (VITAMIN D) 1000 units tablet Take 1,000 Units by mouth daily.    Historical Provider, MD  ferrous sulfate 325 (65 FE) MG EC tablet Take 325 mg by mouth 3 (three) times daily with meals.    Historical Provider, MD  pantoprazole (PROTONIX) 40 MG tablet Take 1 tablet (40 mg total) by mouth daily. 01/28/17 01/28/18  Gregor Hams, MD  Vitamin D, Ergocalciferol, (DRISDOL) 50000 units CAPS capsule Take 1 capsule (50,000 Units total) by mouth every 7 (seven) days. 09/26/16   [name], PA-C    Allergies Patient has no known allergies.  Family History  Problem Relation Age of Onset  . Allergies Mother   . Diabetes Mother     History of DM  . Hyperlipidemia Mother   . Hypertension Mother     But is well controlled due to diet/exercise  . Colon polyps Mother   . Hypertension Father   . Diabetes Father     Type 2  . Hernia Father   . Heart murmur Father   . Colon cancer Cousin 25  . Breast cancer Neg Hx     Social History Social History  Substance Use Topics  . Smoking status: Never Smoker  . Smokeless tobacco: Never Used  . Alcohol use No     Comment: occassional    Review of Systems Constitutional: No fever/chills Eyes: No visual changes. ENT: No sore throat. Cardiovascular: Denies chest pain. Respiratory: Denies shortness of breath. Gastrointestinal:Positive  for abdominal pain and vomiting  Genitourinary: Negative for dysuria. Musculoskeletal: Negative for back pain. Skin: Negative for rash. Neurological: Negative for headaches, focal weakness or numbness.  10-point ROS otherwise negative.  ____________________________________________   PHYSICAL EXAM:  VITAL SIGNS: ED Triage Vitals  Enc Vitals Group     BP 01/28/17 0117 (!) 174/99     Pulse Rate 01/28/17 0117 91     Resp 01/28/17 0117 18     Temp 01/28/17 0119 98.8 F (37.1 C)     Temp Source 01/28/17 0117 Oral     SpO2 01/28/17 0117 99 %     Weight 01/28/17 0116 241 lb  (109.3 kg)     Height 01/28/17 0116 5\' 1"  (1.549 m)     Head Circumference --      Peak Flow --      Pain Score 01/28/17 0116 10     Pain Loc --      Pain Edu? --      Excl. in [place]? --     Constitutional: Alert and oriented. Well appearing and in no acute distress. Eyes: Conjunctivae are normal. PERRL. EOMI. Head: Atraumatic. Mouth/Throat: Mucous membranes are moist.  Oropharynx non-erythematous. Neck: No stridor.   Cardiovascular: Normal rate, regular rhythm. Good peripheral circulation. Grossly normal heart sounds. Respiratory: Normal respiratory effort.  No retractions. Lungs CTAB. Gastrointestinal: Epigastric tenderness to palpation No distention.  Musculoskeletal: No lower extremity tenderness nor edema. No gross deformities of extremities. Neurologic:  Normal speech and language. No gross focal neurologic deficits are appreciated.  Skin:  Skin is warm, dry and intact. No rash noted. Psychiatric: Mood and affect are normal. Speech and behavior are normal.  ____________________________________________   LABS (all labs ordered are listed, but only abnormal results are displayed)  Labs Reviewed  COMPREHENSIVE METABOLIC PANEL - Abnormal; Notable for the following:       Result Value   Glucose, Bld 112 (*)    Total Protein 8.2 (*)    All other components within normal limits  CBC - Abnormal; Notable for the following:    Hemoglobin 11.0 (*)    HCT 33.4 (*)    MCV 72.4 (*)    MCH 23.9 (*)    RDW 17.5 (*)    All other components within normal limits  URINALYSIS, COMPLETE (UACMP) WITH MICROSCOPIC - Abnormal; Notable for the following:    Color, Urine YELLOW (*)    APPearance CLEAR (*)    Hgb urine dipstick LARGE (*)    Ketones, ur 5 (*)    Squamous Epithelial / LPF 0-5 (*)    All other components within normal limits  LIPASE, BLOOD - Abnormal; Notable for the following:    Lipase 73 (*)    All other components within normal limits  POCT PREGNANCY, URINE     RADIOLOGY I, Matador N Joey Lierman, personally viewed and evaluated these images (plain radiographs) as part of my medical decision making, as well as reviewing the written report by the radiologist.  Ct Abdomen Pelvis W Contrast  Result Date: 01/28/2017 CLINICAL DATA:  46 year old female with upper quadrant pain for 4 days. Vomiting with green bile. Post cholecystectomy. EXAM: CT ABDOMEN AND PELVIS WITH CONTRAST TECHNIQUE: Multidetector CT imaging of the abdomen and pelvis was performed using the standard protocol following bolus administration of intravenous contrast. CONTRAST:  170mL ISOVUE-300 IOPAMIDOL (ISOVUE-300) INJECTION 61% COMPARISON:  None. FINDINGS: Lower chest: Lung bases clear.  Heart size top-normal. Hepatobiliary: Elongated liver spanning over 18.4 cm without focal  hepatic lesion. Post cholecystectomy. Central intrahepatic biliary ducts slightly prominent which may be related to the postcholecystectomy state assuming normal liver enzymes. No calcified common bile duct stone. Pancreas: Prominent size pancreas without focal pancreatic lesion, pancreatic duct dilation or surrounding inflammation. Spleen: No mass or enlargement. Adrenals/Urinary Tract: No renal or ureteral obstructing stone or evidence of hydronephrosis. Left renal 1.4 cm cyst. No adrenal lesion. Noncontrast filled contracted urinary bladder without gross abnormality. Stomach/Bowel: No extraluminal bowel inflammatory process, free fluid or free air. Specifically, no inflammation surrounds the appendix. Question small hiatal hernia. Portions of the stomach, small bowel and colon are under distended and evaluation limited for detection of mass/mucosa abnormality. Vascular/Lymphatic: No abdominal aortic aneurysm or large vessel occlusion. No adenopathy. Reproductive: Enlarged uterus containing fibroids one of which is calcified measuring up to 4 cm. No worrisome adnexal mass noted. Other: Diastases rectus muscles. Musculoskeletal:  Mild degenerative changes lower lumbar spine. Sacroiliac joint degenerative changes. IMPRESSION: Post cholecystectomy. Central intrahepatic biliary ducts slightly prominent which may be related to the postcholecystectomy state assuming normal liver enzymes. No calcified common bile duct stone. Prominent size pancreas without focal pancreatic lesion, pancreatic duct dilation or surrounding inflammation. No extraluminal bowel inflammatory process, free fluid or free air. Specifically, no inflammation surrounds the appendix. Question small hiatal hernia. Elongated liver spanning over 18.4 cm. Enlarged uterus containing fibroids, one of which is calcified measuring up to 4 cm. Electronically Signed   By: Genia Del M.D.   On: 01/28/2017 07:11      Procedures    INITIAL IMPRESSION / ASSESSMENT AND PLAN / ED COURSE  Pertinent labs & imaging results that were available during my care of the patient were reviewed by me and considered in my medical decision making (see chart for details).  46 year old female presenting with epigastric abdominal pain auditory data notable for elevated lipase 73 no clear etiology for the patient's pancreatitis patient states she only drinks alcohol very infrequently and never a lot. Patient scan does revealed a hiatal hernia which could potentially be the source of the patient's etiology of epigastric pain as well considering she says she has frequent indigestion.      ____________________________________________  FINAL CLINICAL IMPRESSION(S) / ED DIAGNOSES  Final diagnoses:  Acute pancreatitis, unspecified complication status, unspecified pancreatitis type  Hiatal hernia     MEDICATIONS GIVEN DURING THIS VISIT:  Medications  morphine 4 MG/ML injection 4 mg (4 mg Intravenous Given 01/28/17 0510)  ondansetron (ZOFRAN) injection 4 mg (4 mg Intravenous Given 01/28/17 0509)  iopamidol (ISOVUE-300) 61 % injection 30 mL (30 mLs Oral Contrast Given 01/28/17 0504)   iopamidol (ISOVUE-300) 61 % injection 100 mL (100 mLs Intravenous Contrast Given 01/28/17 0630)     NEW OUTPATIENT MEDICATIONS STARTED DURING THIS VISIT:  Discharge Medication List as of 01/28/2017  7:35 AM    START taking these medications   Details  pantoprazole (PROTONIX) 40 MG tablet Take 1 tablet (40 mg total) by mouth daily., Starting Tue 01/28/2017, Until Wed 01/28/2018, Print        Discharge Medication List as of 01/28/2017  7:35 AM      Discharge Medication List as of 01/28/2017  7:35 AM       Note:  This document was prepared using Dragon voice recognition software and may include unintentional dictation errors.    Gregor Hams, MD 01/29/17 [phone]

## 2017-01-28 NOTE — ED Notes (Signed)
Pt unable to void at this time. 

## 2017-01-31 ENCOUNTER — Ambulatory Visit (INDEPENDENT_AMBULATORY_CARE_PROVIDER_SITE_OTHER): Payer: 59 | Admitting: Physician Assistant

## 2017-01-31 ENCOUNTER — Encounter: Payer: Self-pay | Admitting: Physician Assistant

## 2017-01-31 VITALS — BP 122/70 | HR 76 | Temp 98.2°F | Resp 16 | Wt 237.0 lb

## 2017-01-31 DIAGNOSIS — K859 Acute pancreatitis without necrosis or infection, unspecified: Secondary | ICD-10-CM

## 2017-01-31 MED ORDER — HYDROCODONE-ACETAMINOPHEN 5-325 MG PO TABS: 1.0000 | ORAL_TABLET | Freq: Four times a day (QID) | ORAL | 0 refills | Status: DC | PRN

## 2017-01-31 NOTE — Progress Notes (Signed)
Patient: Sarah Monroe Female    DOB: 1971-09-26   46 y.o.   MRN: [id number] Visit Date: 01/31/2017  Today's Provider: Mar Daring, PA-C   Chief Complaint  Patient presents with  . Follow-up   Subjective:    HPI  Follow up ER visit  Patient was seen in ER for abdominal pain on 01/28/2017. She was treated for acute pancreatitis. Treatment for this included labs, CT scan abdominal pelvis with contrast and start Pantoprazole. She reports good compliance with treatment. She reports this condition is Unchanged. Patient reports symptoms are worse after she eats. Patient reports that she is doing a liquid diet. Patient reports she has been taking OTC Tylenol 1000 mg every 4 hours reports mild symptom control. She does report she is passing gas but is having some increasing constipation. She has had cholecystectomy with Dr. Jamal Collin. She also underwent a colonoscopy with Dr. Vicente Males in 10/2016 and was normal to repeat in 5 years.  ------------------------------------------------------------------------------------    No Known Allergies   Current Outpatient Prescriptions:  .  acetaminophen (TYLENOL) 500 MG tablet, Take 500 mg by mouth every 4 (four) hours as needed., Disp: , Rfl:  .  pantoprazole (PROTONIX) 40 MG tablet, Take 1 tablet (40 mg total) by mouth daily., Disp: 30 tablet, Rfl: 0 .  Cetirizine HCl (ZYRTEC ALLERGY) 10 MG CAPS, Take 10 mg by mouth daily as needed., Disp: , Rfl:  .  cholecalciferol (VITAMIN D) 1000 units tablet, Take 1,000 Units by mouth daily., Disp: , Rfl:  .  ferrous sulfate 325 (65 FE) MG EC tablet, Take 325 mg by mouth 3 (three) times daily with meals., Disp: , Rfl:  .  nystatin (MYCOSTATIN) 100000 UNIT/ML suspension, Take 5 mLs by mouth 4 (four) times daily., Disp: , Rfl:  .  Vitamin D, Ergocalciferol, (DRISDOL) 50000 units CAPS capsule, Take 1 capsule (50,000 Units total) by mouth every 7 (seven) days. (Patient not taking: Reported on 01/31/2017),  Disp: 8 capsule, Rfl: 0  Review of Systems  Constitutional: Negative.   Respiratory: Negative.   Cardiovascular: Negative.   Gastrointestinal: Positive for abdominal distention, abdominal pain and constipation.  Musculoskeletal: Positive for back pain.    Social History  Substance Use Topics  . Smoking status: Never Smoker  . Smokeless tobacco: Never Used  . Alcohol use No     Comment: occassional   Objective:   BP 122/70 (BP Location: Left Arm, Patient Position: Sitting, Cuff Size: Large)   Pulse 76   Temp 98.2 F (36.8 C) (Oral)   Resp 16   Wt 237 lb (107.5 kg)   LMP 01/28/2017 (Exact Date)   SpO2 99%   BMI 44.78 kg/m  Vitals:   01/31/17 1110  BP: 122/70  Pulse: 76  Resp: 16  Temp: 98.2 F (36.8 C)  TempSrc: Oral  SpO2: 99%  Weight: 237 lb (107.5 kg)     Physical Exam  Constitutional: She is oriented to person, place, and time. She appears well-developed and well-nourished. No distress.  Cardiovascular: Normal rate, regular rhythm and normal heart sounds.  Exam reveals no gallop and no friction rub.   No murmur heard. Pulmonary/Chest: Effort normal and breath sounds normal. No respiratory distress. She has no wheezes. She has no rales.  Abdominal: Soft. Normal appearance and bowel sounds are normal. She exhibits no distension and no mass. There is no hepatosplenomegaly. There is tenderness in the right upper quadrant and epigastric area. There is guarding. There is  no rebound and no CVA tenderness.  Neurological: She is alert and oriented to person, place, and time.  Skin: Skin is warm and dry. She is not diaphoretic.  Vitals reviewed.      Assessment & Plan:     1. Acute pancreatitis, unspecified complication status, unspecified pancreatitis type Norco given as below x 5 day supply for pain control. NCSR was reviewed and patient has not had any controlled substances prescribed to her. Referral placed back to Dr. Vicente Males for further evaluation as patient may  require an ERCP. Discussed pancreatitis and for her to start trying to increase diet slowly since she has been on liquid diet since Sunday. She does have family history of pancreatitis in her maternal grandmother. I will see her back in 1 week to recheck.  - HYDROcodone-acetaminophen (NORCO/VICODIN) 5-325 MG tablet; Take 1 tablet by mouth every 6 (six) hours as needed for moderate pain.  Dispense: 30 tablet; Refill: 0 - Ambulatory referral to Gastroenterology       Mar Daring, PA-C  [facility]

## 2017-01-31 NOTE — Patient Instructions (Signed)
Acute Pancreatitis  Acute pancreatitis is a condition in which the pancreas suddenly gets irritated and swollen (has inflammation). The pancreas is a large gland behind the stomach. It makes enzymes that help to digest food. The pancreas also makes hormones that help to control your blood sugar. Acute pancreatitis happens when the enzymes attack the pancreas and damage it. Most attacks last a couple of days and can cause serious problems.  Follow these instructions at home:  Eating and drinking   · Follow instructions from your doctor about diet. You may need to:  ? Avoid alcohol.  ? Limit how much fat is in your diet.  · Eat small meals often. Avoid eating big meals.  · Drink enough fluid to keep your pee (urine) clear or pale yellow.  · Do not drink alcohol if it caused your condition.  General instructions   · Take over-the-counter and prescription medicines only as told by your doctor.  · Do not use any tobacco products. These include cigarettes, chewing tobacco, and e-cigarettes. If you need help quitting, ask your doctor.  · Get plenty of rest.  · If directed, check your blood sugar at home as told by your doctor.  · Keep all follow-up visits as told by your doctor. This is important.  Contact a doctor if:  · You do not get better as quickly as expected.  · You have new symptoms.  · Your symptoms get worse.  · You have lasting pain or weakness.  · You continue to feel sick to your stomach (nauseous).  · You get better and then you have another pain attack.  · You have a fever.  Get help right away if:  · You cannot eat or keep fluids down.  · Your pain becomes very bad.  · Your skin or the white part of your eyes turns yellow (jaundice).  · You throw up (vomit).  · You feel dizzy or you pass out (faint).  · Your blood sugar is high (over 300 mg/dL).  This information is not intended to replace advice given to you by your health care provider. Make sure you discuss any questions you have with your health care  provider.  Document Released: 05/06/2008 Document Revised: 04/25/2016 Document Reviewed: 08/22/2015  [employer]vier Interactive Patient Education © 2017 [employer]

## 2017-02-03 ENCOUNTER — Encounter: Payer: Self-pay | Admitting: Gastroenterology

## 2017-02-03 ENCOUNTER — Ambulatory Visit (INDEPENDENT_AMBULATORY_CARE_PROVIDER_SITE_OTHER): Payer: 59 | Admitting: Gastroenterology

## 2017-02-03 VITALS — BP 130/71 | HR 65 | Ht 61.0 in | Wt 237.0 lb

## 2017-02-03 DIAGNOSIS — R1013 Epigastric pain: Secondary | ICD-10-CM

## 2017-02-03 NOTE — Progress Notes (Signed)
Gastroenterology Consultation  Referring Provider:     Florian Buff* Primary Care Physician:  Mar Daring, PA-C Primary Gastroenterologist:  Dr. Allen Norris     Reason for Consultation:     Acute pancreatitis        HPI:   Sarah Monroe is a 46 y.o. y/o female referred for consultation & management of Acute pancreatitis by Dr. Mar Daring, PA-C.  This woman comes in today after being told that she had cryptitis at the ER. The patient had a lipase that was only slightly elevated at 78 but had epigastric pain. The patient states it started after she had eaten a high protein drinks that she was getting ready for starting [place] workout. The patient states that her pain is almost gone at this point. She denies ever having pancreatitis in the past. The patient also had a CT scan that did not show any signs of inflammation in the pancreas. The patient also has had her gallbladder out in the past and had a common bile duct that was consistent with having her gallbladder out but no sign of gallstones. There is no report of any unexplained weight loss. The patient denies any alcohol abuse.  Past Medical History:  Diagnosis Date  . Anemia   . Anxiety   . Hyperlipidemia     Past Surgical History:  Procedure Laterality Date  . CESAREAN SECTION     x3  . COLONOSCOPY WITH PROPOFOL N/A 10/18/2016   Procedure: COLONOSCOPY WITH PROPOFOL;  Surgeon: Jonathon Bellows, MD;  Location: [facility] ENDOSCOPY;  Service: Endoscopy;  Laterality: N/A;  . Endometriotic cyst     Dr. Jamal Collin  . GALLBLADDER SURGERY  1999   Dr. Kirtland Bouchard  . Post cholcystectomy  1998  . TUBAL LIGATION Bilateral     Prior to Admission medications   Medication Sig Start Date End Date Taking? Authorizing Provider  acetaminophen (TYLENOL) 500 MG tablet Take 500 mg by mouth every 4 (four) hours as needed.   Yes Historical Provider, MD  Cetirizine HCl (ZYRTEC ALLERGY) 10 MG CAPS Take 10 mg by mouth daily as needed. 01/31/11   Yes Historical Provider, MD  ferrous sulfate 325 (65 FE) MG EC tablet Take 325 mg by mouth 3 (three) times daily with meals.   Yes Historical Provider, MD  nystatin (MYCOSTATIN) 100000 UNIT/ML suspension Take 5 mLs by mouth 4 (four) times daily.   Yes Historical Provider, MD  pantoprazole (PROTONIX) 40 MG tablet Take 1 tablet (40 mg total) by mouth daily. 01/28/17 01/28/18 Yes Gregor Hams, MD  Vitamin D, Ergocalciferol, (DRISDOL) 50000 units CAPS capsule Take 1 capsule (50,000 Units total) by mouth every 7 (seven) days. 09/26/16  Yes [name], PA-C  HYDROcodone-acetaminophen (NORCO/VICODIN) 5-325 MG tablet Take 1 tablet by mouth every 6 (six) hours as needed for moderate pain. Patient not taking: Reported on 02/03/2017 01/31/17   Mar Daring, PA-C    Family History  Problem Relation Age of Onset  . Allergies Mother   . Diabetes Mother     History of DM  . Hyperlipidemia Mother   . Hypertension Mother     But is well controlled due to diet/exercise  . Colon polyps Mother   . Hypertension Father   . Diabetes Father     Type 2  . Hernia Father   . Heart murmur Father   . Colon cancer Cousin 4  . Breast cancer Neg Hx  Social History  Substance Use Topics  . Smoking status: Never Smoker  . Smokeless tobacco: Never Used  . Alcohol use No     Comment: occassional    Allergies as of 02/03/2017  . (No Known Allergies)    Review of Systems:    All systems reviewed and negative except where noted in HPI.   Physical Exam:  BP 130/71   Pulse 65   Ht 5\' 1"  (1.549 m)   Wt 237 lb (107.5 kg)   LMP 01/28/2017 (Exact Date)   BMI 44.78 kg/m  Patient's last menstrual period was 01/28/2017 (exact date). Psych:  Alert and cooperative. Normal mood and affect. General:   Alert,  Well-developed, Obese,  well-nourished, pleasant and cooperative in NAD Head:  Normocephalic and atraumatic. Eyes:  Sclera clear, no icterus.   Conjunctiva pink. Ears:  Normal auditory  acuity. Nose:  No deformity, discharge, or lesions. Mouth:  No deformity or lesions,oropharynx pink & moist. Neck:  Supple; no masses or thyromegaly. Lungs:  Respirations even and unlabored.  Clear throughout to auscultation.   No wheezes, crackles, or rhonchi. No acute distress. Heart:  Regular rate and rhythm; no murmurs, clicks, rubs, or gallops. Abdomen:  Normal bowel sounds.  No bruits.  Soft, non-tender and non-distended without masses, hepatosplenomegaly or hernias noted.  No guarding or rebound tenderness.  Negative Carnett sign.   Rectal:  Deferred.  Msk:  Symmetrical without gross deformities.  Good, equal movement & strength bilaterally. Pulses:  Normal pulses noted. Extremities:  No clubbing or edema.  No cyanosis. Neurologic:  Alert and oriented x3;  grossly normal neurologically. Skin:  Intact without significant lesions or rashes.  No jaundice. Lymph Nodes:  No significant cervical adenopathy. Psych:  Alert and cooperative. Normal mood and affect.  Imaging Studies: Ct Abdomen Pelvis W Contrast  Result Date: 01/28/2017 CLINICAL DATA:  46 year old female with upper quadrant pain for 4 days. Vomiting with green bile. Post cholecystectomy. EXAM: CT ABDOMEN AND PELVIS WITH CONTRAST TECHNIQUE: Multidetector CT imaging of the abdomen and pelvis was performed using the standard protocol following bolus administration of intravenous contrast. CONTRAST:  161mL ISOVUE-300 IOPAMIDOL (ISOVUE-300) INJECTION 61% COMPARISON:  None. FINDINGS: Lower chest: Lung bases clear.  Heart size top-normal. Hepatobiliary: Elongated liver spanning over 18.4 cm without focal hepatic lesion. Post cholecystectomy. Central intrahepatic biliary ducts slightly prominent which may be related to the postcholecystectomy state assuming normal liver enzymes. No calcified common bile duct stone. Pancreas: Prominent size pancreas without focal pancreatic lesion, pancreatic duct dilation or surrounding inflammation. Spleen:  No mass or enlargement. Adrenals/Urinary Tract: No renal or ureteral obstructing stone or evidence of hydronephrosis. Left renal 1.4 cm cyst. No adrenal lesion. Noncontrast filled contracted urinary bladder without gross abnormality. Stomach/Bowel: No extraluminal bowel inflammatory process, free fluid or free air. Specifically, no inflammation surrounds the appendix. Question small hiatal hernia. Portions of the stomach, small bowel and colon are under distended and evaluation limited for detection of mass/mucosa abnormality. Vascular/Lymphatic: No abdominal aortic aneurysm or large vessel occlusion. No adenopathy. Reproductive: Enlarged uterus containing fibroids one of which is calcified measuring up to 4 cm. No worrisome adnexal mass noted. Other: Diastases rectus muscles. Musculoskeletal: Mild degenerative changes lower lumbar spine. Sacroiliac joint degenerative changes. IMPRESSION: Post cholecystectomy. Central intrahepatic biliary ducts slightly prominent which may be related to the postcholecystectomy state assuming normal liver enzymes. No calcified common bile duct stone. Prominent size pancreas without focal pancreatic lesion, pancreatic duct dilation or surrounding inflammation. No extraluminal bowel inflammatory process, free  fluid or free air. Specifically, no inflammation surrounds the appendix. Question small hiatal hernia. Elongated liver spanning over 18.4 cm. Enlarged uterus containing fibroids, one of which is calcified measuring up to 4 cm. Electronically Signed   By: Genia Del M.D.   On: 01/28/2017 07:11    Assessment and Plan:   NIAJA BAIN is a 46 y.o. y/o female who comes in today after being in the ER and told she had PEG retires. The patient did not meet criteria for pancreatic has with out the CT scan consistent with pancreatic that his or her pancreatic enzymes being high enough for diagnosis of pancreatic has. The patient has been told that her pancreatic enzymes may have  been on the way down when she was in the ER and whether she had PEG has or not should not make a difference with no intervention needed at this time. The patient has a reoccurrence of her pancreatitis then a further workup would be necessary. The patient has been explained the plan and agrees with it.    Lucilla Lame, MD. [facility]   Note: This dictation was prepared with Dragon dictation along with smaller phrase technology. Any transcriptional errors that result from this process are unintentional.

## 2017-02-07 ENCOUNTER — Ambulatory Visit: Payer: 59 | Admitting: Physician Assistant

## 2017-09-18 ENCOUNTER — Encounter: Payer: Self-pay | Admitting: Physician Assistant

## 2017-09-18 ENCOUNTER — Encounter: Payer: 59 | Admitting: Physician Assistant

## 2017-09-18 ENCOUNTER — Ambulatory Visit (INDEPENDENT_AMBULATORY_CARE_PROVIDER_SITE_OTHER): Payer: 59 | Admitting: Physician Assistant

## 2017-09-18 VITALS — BP 128/76 | HR 68 | Temp 98.2°F | Resp 16 | Ht 61.0 in | Wt 245.2 lb

## 2017-09-18 DIAGNOSIS — E611 Iron deficiency: Secondary | ICD-10-CM

## 2017-09-18 DIAGNOSIS — Z713 Dietary counseling and surveillance: Secondary | ICD-10-CM

## 2017-09-18 DIAGNOSIS — K219 Gastro-esophageal reflux disease without esophagitis: Secondary | ICD-10-CM | POA: Diagnosis not present

## 2017-09-18 DIAGNOSIS — E559 Vitamin D deficiency, unspecified: Secondary | ICD-10-CM | POA: Diagnosis not present

## 2017-09-18 DIAGNOSIS — Z6841 Body Mass Index (BMI) 40.0 and over, adult: Secondary | ICD-10-CM

## 2017-09-18 MED ORDER — PANTOPRAZOLE SODIUM 40 MG PO TBEC: 40.0000 mg | DELAYED_RELEASE_TABLET | Freq: Every day | ORAL | 5 refills | Status: DC

## 2017-09-18 MED ORDER — PHENTERMINE HCL 37.5 MG PO TABS: 37.5000 mg | ORAL_TABLET | Freq: Every day | ORAL | 2 refills | Status: DC

## 2017-09-18 NOTE — Patient Instructions (Signed)

## 2017-09-18 NOTE — Progress Notes (Signed)
Patient: Sarah Monroe Female    DOB: 1971-02-25   46 y.o.   MRN: [id number] Visit Date: 09/18/2017  Today's Provider: Mar Daring, PA-C   Chief Complaint  Patient presents with  . needs biometric form done  . Obesity   Subjective:    HPI Patient comes in today for evaluation of weight management. She also needs a form filled out through her employer. She reports that last year she lost up to 25lbs. She reports that she has gained all of her weight back due to caring for her ill father, he has dementia. She wants to discuss options to help with weight loss.   She is also interested in having her iron and Vit D rechecked as she has had deficiencies in these. She is taking iron supplement but not taking Vit D.   She is also starting to have worsening GERD symptoms again and would like to restart protonix.    No Known Allergies   Current Outpatient Prescriptions:  .  acetaminophen (TYLENOL) 500 MG tablet, Take 500 mg by mouth every 4 (four) hours as needed., Disp: , Rfl:  .  Cetirizine HCl (ZYRTEC ALLERGY) 10 MG CAPS, Take 10 mg by mouth daily as needed., Disp: , Rfl:  .  pantoprazole (PROTONIX) 40 MG tablet, Take 1 tablet (40 mg total) by mouth daily., Disp: 30 tablet, Rfl: 0 .  ferrous sulfate 325 (65 FE) MG EC tablet, Take 325 mg by mouth 3 (three) times daily with meals., Disp: , Rfl:  .  HYDROcodone-acetaminophen (NORCO/VICODIN) 5-325 MG tablet, Take 1 tablet by mouth every 6 (six) hours as needed for moderate pain. (Patient not taking: Reported on 02/03/2017), Disp: 30 tablet, Rfl: 0 .  nystatin (MYCOSTATIN) 100000 UNIT/ML suspension, Take 5 mLs by mouth 4 (four) times daily., Disp: , Rfl:  .  Vitamin D, Ergocalciferol, (DRISDOL) 50000 units CAPS capsule, Take 1 capsule (50,000 Units total) by mouth every 7 (seven) days. (Patient not taking: Reported on 09/18/2017), Disp: 8 capsule, Rfl: 0  Review of Systems  Constitutional: Positive for appetite change.  Negative for activity change, chills, diaphoresis, fatigue, fever and unexpected weight change.  Cardiovascular: Negative for chest pain, palpitations and leg swelling.  Gastrointestinal: Negative.   Musculoskeletal: Negative.   Neurological: Negative.   Psychiatric/Behavioral: Negative.     Social History  Substance Use Topics  . Smoking status: Never Smoker  . Smokeless tobacco: Never Used  . Alcohol use No     Comment: occassional   Objective:   BP 128/76 (BP Location: Left Arm, Patient Position: Sitting, Cuff Size: Large)   Pulse 68   Temp 98.2 F (36.8 C)   Resp 16   Ht 5\' 1"  (1.549 m)   Wt 245 lb 3.2 oz (111.2 kg)   BMI 46.33 kg/m  Vitals:   09/18/17 1013  BP: 128/76  Pulse: 68  Resp: 16  Temp: 98.2 F (36.8 C)  Weight: 245 lb 3.2 oz (111.2 kg)  Height: 5\' 1"  (1.549 m)     Physical Exam  Constitutional: She appears well-developed and well-nourished. No distress.  obese  Neck: Normal range of motion. Neck supple.  Cardiovascular: Normal rate, regular rhythm and normal heart sounds.  Exam reveals no gallop and no friction rub.   No murmur heard. Pulmonary/Chest: Effort normal and breath sounds normal. No respiratory distress. She has no wheezes. She has no rales.  Skin: She is not diaphoretic.  Vitals reviewed.  Assessment & Plan:     1. Encounter for weight loss counseling Will start phentermine. Had a long conversation with patient about weight loss, dieting habits, food diary, and meal planning. We also discussed exercise. Patient is to try to adhere to a 1200 calorie diet. I will see her back in 3 months to recheck weight.  - phentermine (ADIPEX-P) 37.5 MG tablet; Take 1 tablet (37.5 mg total) by mouth daily before breakfast.  Dispense: 30 tablet; Refill: 2  2. Morbid obesity with BMI of 45.0-49.9, adult [facility]) See above medical treatment plan. - phentermine (ADIPEX-P) 37.5 MG tablet; Take 1 tablet (37.5 mg total) by mouth daily before breakfast.   Dispense: 30 tablet; Refill: 2  3. Vitamin D deficiency H/O this. Will check labs as below and f/u pending results. - CBC w/Diff/Platelet - Vitamin D (25 hydroxy) - TSH  4. Iron deficiency H/O this. On ferrous sulfate 325mg . Will check labs as below and f/u pending results. - CBC w/Diff/Platelet - TSH - Iron  5. Gastroesophageal reflux disease, esophagitis presence not specified Stable. Diagnosis pulled for medication refill. Continue current medical treatment plan. - pantoprazole (PROTONIX) 40 MG tablet; Take 1 tablet (40 mg total) by mouth daily.  Dispense: 30 tablet; Refill: 5  I spent approximately 45 minutes with the patient today. Over 50% of this time was spent with counseling and educating the patient.       Mar Daring, PA-C  [facility]

## 2017-09-25 ENCOUNTER — Encounter: Payer: 59 | Admitting: Physician Assistant

## 2017-12-19 ENCOUNTER — Ambulatory Visit: Payer: 59 | Admitting: Physician Assistant

## 2018-09-21 ENCOUNTER — Encounter: Payer: Self-pay | Admitting: Physician Assistant

## 2018-09-21 ENCOUNTER — Ambulatory Visit (INDEPENDENT_AMBULATORY_CARE_PROVIDER_SITE_OTHER): Payer: 59 | Admitting: Physician Assistant

## 2018-09-21 VITALS — BP 130/70 | HR 75 | Temp 98.0°F | Resp 16 | Ht 61.0 in | Wt 246.4 lb

## 2018-09-21 DIAGNOSIS — K219 Gastro-esophageal reflux disease without esophagitis: Secondary | ICD-10-CM | POA: Diagnosis not present

## 2018-09-21 DIAGNOSIS — M7521 Bicipital tendinitis, right shoulder: Secondary | ICD-10-CM | POA: Diagnosis not present

## 2018-09-21 DIAGNOSIS — D649 Anemia, unspecified: Secondary | ICD-10-CM | POA: Diagnosis not present

## 2018-09-21 DIAGNOSIS — N951 Menopausal and female climacteric states: Secondary | ICD-10-CM

## 2018-09-21 DIAGNOSIS — Z0000 Encounter for general adult medical examination without abnormal findings: Secondary | ICD-10-CM

## 2018-09-21 MED ORDER — PANTOPRAZOLE SODIUM 40 MG PO TBEC: 40.0000 mg | DELAYED_RELEASE_TABLET | Freq: Every day | ORAL | 3 refills | Status: DC

## 2018-09-21 NOTE — Patient Instructions (Addendum)
Biceps Tendon Tendinitis (Proximal) and Tenosynovitis The proximal biceps tendon is a strong cord of tissue that connects the biceps muscle, on the front of the upper arm, to the shoulder blade. Tendinitis is inflammation of a tendon. Tenosynovitis is inflammation of the lining around the tendon (tendon sheath). These conditions often occur at the same time, and they can interfere with the ability to bend the elbow and turn the hand palm-up (supination). Proximal biceps tendon tendinitis and tenosynovitis are usually caused by overusing the shoulder joint and the biceps muscle. These conditions usually heal within 6 weeks. Proximal biceps tendon tendinitis may include a grade 1 or grade 2 strain of the tendon. A grade 1 strain is mild, and it involves a slight pull of the tendon without any stretching or noticeable tearing of the tendon. There is usually no loss of biceps muscle strength. A grade 2 strain is moderate, and it involves a small tear in the tendon. The tendon is stretched, and biceps strength is usually decreased. What are the causes? This condition may be caused by:  A sudden increase in frequency or intensity of activity that involves the shoulder and the biceps muscle.  Overuse of the biceps muscle. This can happen when you do the same movements over and over, such as: ? Supination. ? Forceful straightening (hyperextension) of the elbow. ? Bending the elbow.  A direct, forceful hit or injury (trauma) to the elbow. This is rare.  What increases the risk? The following factors may make you more likely to develop this condition:  Playing contact sports.  Playing sports that involve throwing and overhead movements, including racket sports, gymnastics, weight lifting, or bodybuilding.  Doing physical labor.  Having poor strength and flexibility of the arm and shoulder.  What are the signs or symptoms? Symptoms of this condition may include:  Pain and inflammation in the front  of the shoulder. Pain may get worse with movement, especially when you use resistance, as in weight lifting.  A feeling of warmth in the front of the shoulder.  Limited range of motion of the shoulder and the elbow.  A crackling sound (crepitation) when you move or touch the shoulder or the upper arm.  In some cases, symptoms may return (recur) after treatment, and they may be long-lasting (chronic). How is this diagnosed? This condition is diagnosed based on your symptoms, your medical history, and a physical exam. You may have tests, including X-rays or MRIs. Your health care provider may test your range of motion by asking you to do arm movements. How is this treated? This condition is treated by resting and icing the injured area, and by doing physical therapy exercises. Depending on the severity of your condition, treatment may also include:  Medicines to help relieve pain and inflammation.  Ultrasound therapy. This is the application of sound waves to the injured area.  Injecting medicines (corticosteroids) into your tendon sheath.  Injecting medicines that numb the area (local anesthetics).  Surgery to remove the damaged part of the tendon and reattach the undamaged part of the tendon to the arm bone (humerus). This is usually only done if you have symptoms that do not get better with other treatment methods.  Follow these instructions at home: Managing pain, stiffness, and swelling  If directed, put ice on the injured area: ? Put ice in a plastic bag. ? Place a towel between your skin and the bag. ? Leave the ice on for 20 minutes, 2-3 times a day.    Move your fingers often to avoid stiffness and to lessen swelling.  Raise (elevate) the injured area above the level of your heart while you are sitting or lying down.  If directed, apply heat to the affected area before you exercise. Use the heat source that your health care provider recommends, such as a moist heat pack or a  heating pad. ? Place a towel between your skin and the heat source. ? Leave the heat on for 20-30 minutes. ? Remove the heat if your skin turns bright red. This is especially important if you are unable to feel pain, heat, or cold. You may have a greater risk of getting burned. Activity  Return to your normal activities as told by your health care provider. Ask your health care provider what activities are safe for you.  Do not lift anything that is heavier than 10 lb (4.5 kg) until your health care provider tells you that it is safe.  Avoid activities that cause pain or make your condition worse.  Do exercises as told by your health care provider. General instructions  Take over-the-counter and prescription medicines only as told by your health care provider.  Do not drive or operate heavy machinery while taking prescription pain medicines.  Keep all follow-up visits as told by your health care provider. This is important. How is this prevented?  Warm up and stretch before being active.  Cool down and stretch after being active.  Give your body time to rest between periods of activity.  Make sure any equipment that you use is fitted to you.  Be safe and responsible while being active to avoid falls.  Do at least 150 minutes of moderate-intensity aerobic exercise each week, such as brisk walking or water aerobics.  Maintain physical fitness, including: ? Strength. ? Flexibility. ? Cardiovascular fitness. ? Endurance. Contact a health care provider if:  You have symptoms that get worse or do not get better after 2 weeks of treatment.  You develop new symptoms. Get help right away if:  You develop severe pain. This information is not intended to replace advice given to you by your health care provider. Make sure you discuss any questions you have with your health care provider. Document Released: 11/18/2005 Document Revised: 07/25/2016 Document Reviewed:  10/27/2015 Elsevier Interactive Patient Education  2018 [employer]   Biceps Tendon Tendinitis (Proximal) and Tenosynovitis Rehab Ask your health care provider which exercises are safe for you. Do exercises exactly as told by your health care provider and adjust them as directed. It is normal to feel mild stretching, pulling, tightness, or discomfort as you do these exercises, but you should stop right away if you feel sudden pain or your pain gets worse.Do not begin these exercises until told by your health care provider. Stretching and range of motion exercises These exercises warm up your muscles and joints and improve the movement and flexibility of your arm and shoulder. These exercises also help to relieve pain and stiffness. Exercise A: Shoulder flexion  1. Stand facing a wall. Put your left / right hand on the wall. 2. Slide your left / right hand up the wall. Stop when you feel a stretch in your shoulder, or when you reach the angle that is recommended by your health care provider. ? Use your other hand to help raise your arm, if needed. ? As your hand gets higher, you may need to step closer to the wall. ? Avoid shrugging your shoulder while you raise   your arm. To do this, keep your shoulder blade tucked down toward your spine. 3. Hold for __________ seconds. 4. Slowly return to the starting position. Use your other arm to help, if needed. Repeat __________ times. Complete this exercise __________ times a day. Exercise B: Posterior capsule stretch ( passive horizontal adduction) 1. Sit or stand and pull your left / right elbow across your chest, toward your other shoulder. Stop when you feel a gentle stretch in the back of your shoulder and upper arm. ? Keep your arm at shoulder height. ? Keep your arm as close to your body as you comfortably can. 2. Hold for __________ seconds. 3. Slowly return to the starting position. Repeat __________ times. Complete this exercise  __________ times a day. Strengthening exercises These exercises build strength and endurance in your arm and shoulder. Endurance is the ability to use your muscles for a long time, even after your muscles get tired. Exercise C: Elbow flexion, supinated  1. Sit on a stable chair without armrests, or stand. 2. If directed, hold a __________ weight in your left / right hand, or hold an exercise band with both hands. Your palms should face up toward the ceiling at the starting position. 3. Bend your left / right elbow and move your hand up toward your shoulder. Keep your other arm straight down, in the starting position. 4. Slowly return to the starting position. Repeat __________ times. Complete this exercise __________ times a day. Exercise D: Scapular protraction, supine  1. Lie on your back on a firm surface. If directed, hold a __________ weight in your left / right hand. 2. Raise your left / right arm straight into the air so your hand is directly above your shoulder joint. 3. Push the weight into the air so your shoulder lifts off of the surface that you are lying on. Do not move your head, neck, or back. 4. Hold for __________ seconds. 5. Slowly return to the starting position. Let your muscles relax completely before you repeat this exercise. Repeat __________ times. Complete this exercise __________ times a day. Exercise E: Scapular retraction  1. Sit in a stable chair without armrests, or stand. 2. Secure an exercise band to a stable object in front of you so the band is at shoulder height. 3. Hold one end of the exercise band in each hand. 4. Squeeze your shoulder blades together and move your elbows slightly behind you. Do not shrug your shoulders. 5. Hold for __________ seconds. 6. Slowly return to the starting position. Repeat __________ times. Complete this exercise __________ times a day. This information is not intended to replace advice given to you by your health care  provider. Make sure you discuss any questions you have with your health care provider. Document Released: 11/18/2005 Document Revised: 07/25/2016 Document Reviewed: 10/27/2015 Elsevier Interactive Patient Education  2018 [employer]   

## 2018-09-21 NOTE — Progress Notes (Signed)
Patient: Sarah Monroe, Female    DOB: [birth year], 47 y.o.   MRN: [id number] Visit Date: 09/21/2018  Today's Provider: Mar Daring, PA-C   Chief Complaint  Patient presents with  . Annual Exam   Subjective:    Annual physical exam GLADYS DECKARD is a 47 y.o. female who presents today for health maintenance and complete physical. She feels well. She reports exercising none. She reports she is sleeping well.  Last CPE:09/24/16 Mammogram:10/28/16 BI-RADS 1 Colonoscopy:10/18/16-Repeat in 5 yrs. WUJ:[id number] at [facility] Nov.4  She does report having more irregular menstrual cycles. Reports missing a month, then will have for 2-3 days when it does come on. Does report hot flashes and some memory issues.   Also reports some pain over the right anterior shoulder. Reports she sleeps on her right shoulder, but has trying to avoid sleeping on that shoulder. She has also been trying to not carry her pocket book on that side. Denies numbness or tingling. Does notice some weakness, unable to lift as much on the right as she once could.  -----------------------------------------------------------------   Review of Systems  Constitutional: Positive for diaphoresis.  HENT: Negative.   Eyes: Positive for itching.  Respiratory: Negative.   Cardiovascular: Negative.   Gastrointestinal: Negative.   Endocrine: Negative.   Genitourinary: Positive for menstrual problem.  Musculoskeletal: Positive for arthralgias.  Skin: Negative.   Allergic/Immunologic: Negative.   Neurological: Negative.   Hematological: Bruises/bleeds easily.  Psychiatric/Behavioral: Negative.     Social History      She  reports that she has never smoked. She has never used smokeless tobacco. She reports that she does not drink alcohol or use drugs.       Social History   Socioeconomic History  . Marital status: Single    Spouse name: Not on file  . Number of children: Not on file  . Years of  education: Not on file  . Highest education level: Not on file  Occupational History  . Not on file  Social Needs  . Financial resource strain: Not on file  . Food insecurity:    Worry: Not on file    Inability: Not on file  . Transportation needs:    Medical: Not on file    Non-medical: Not on file  Tobacco Use  . Smoking status: Never Smoker  . Smokeless tobacco: Never Used  Substance and Sexual Activity  . Alcohol use: No    Comment: occassional  . Drug use: No  . Sexual activity: Yes    Birth control/protection: None    Comment: Tubal ligation; monogomous relationship with female  Lifestyle  . Physical activity:    Days per week: Not on file    Minutes per session: Not on file  . Stress: Not on file  Relationships  . Social connections:    Talks on phone: Not on file    Gets together: Not on file    Attends religious service: Not on file    Active member of club or organization: Not on file    Attends meetings of clubs or organizations: Not on file    Relationship status: Not on file  Other Topics Concern  . Not on file  Social History Narrative  . Not on file    Past Medical History:  Diagnosis Date  . Anemia   . Anxiety   . Hyperlipidemia      Patient Active Problem List   Diagnosis Date Noted  .  Morbidly obese ([place]) 09/29/2015  . BMI 40.0-44.9, adult ([place]) 09/29/2015  . Avitaminosis D 08/23/2015  . Allergic rhinitis 08/22/2015  . Absolute anemia 08/22/2015  . Anxiety 08/22/2015  . Chest pain 08/22/2015  . Borderline diabetes 08/22/2015  . Hypercholesteremia 08/22/2015  . Injury of knee, leg, ankle and foot 08/22/2015  . Puberty bleeding 08/22/2015  . Adiposity 08/22/2015  . Pain in joint, lower leg 08/22/2015  . H/O female genital system disorder 08/22/2015    Past Surgical History:  Procedure Laterality Date  . CESAREAN SECTION     x3  . COLONOSCOPY WITH PROPOFOL N/A 10/18/2016   Procedure: COLONOSCOPY WITH PROPOFOL;  Surgeon: Jonathon Bellows,  MD;  Location: [facility] ENDOSCOPY;  Service: Endoscopy;  Laterality: N/A;  . Endometriotic cyst     Dr. Jamal Collin  . GALLBLADDER SURGERY  1999   Dr. Kirtland Bouchard  . Post cholcystectomy  1998  . TUBAL LIGATION Bilateral     Family History        Family Status  Relation Name Status  . Mother  Alive  . Father  Alive       Poor health; Internal gastric bleeding 67  . MGM  Deceased at age 29       Pancreatic cancer  . MGF  Deceased at age 72       COPD  . PGM  Deceased at age 32       Coronary artery disease;Myocardial Infarction  . PGF  Deceased       died of unknown causes; died before patient was born  . Cousin  Alive  . Neg Hx  (Not Specified)        Her family history includes Allergies in her mother; Colon cancer (age of onset: 46) in her cousin; Colon polyps in her mother; Diabetes in her father and mother; Heart murmur in her father; Hernia in her father; Hyperlipidemia in her mother; Hypertension in her father and mother. There is no history of Breast cancer.      No Known Allergies   Current Outpatient Medications:  .  acetaminophen (TYLENOL) 500 MG tablet, Take 500 mg by mouth every 4 (four) hours as needed., Disp: , Rfl:  .  Cetirizine HCl (ZYRTEC ALLERGY) 10 MG CAPS, Take 10 mg by mouth daily as needed., Disp: , Rfl:  .  ferrous sulfate 325 (65 FE) MG EC tablet, Take 325 mg by mouth 3 (three) times daily with meals., Disp: , Rfl:  .  nystatin (MYCOSTATIN) 100000 UNIT/ML suspension, Take 5 mLs by mouth 4 (four) times daily., Disp: , Rfl:  .  pantoprazole (PROTONIX) 40 MG tablet, Take 1 tablet (40 mg total) by mouth daily., Disp: 30 tablet, Rfl: 5 .  phentermine (ADIPEX-P) 37.5 MG tablet, Take 1 tablet (37.5 mg total) by mouth daily before breakfast. (Patient not taking: Reported on 09/21/2018), Disp: 30 tablet, Rfl: 2   Patient Care Team: Rubye Beach as PCP - General (Physician Assistant)      Objective:   Vitals: BP 130/70 (BP Location: Left Wrist, Patient  Position: Sitting, Cuff Size: Normal)   Pulse 75   Temp 98 F (36.7 C) (Oral)   Resp 16   Ht 5\' 1"  (1.549 m)   Wt 246 lb 6.4 oz (111.8 kg)   LMP  (Within Weeks)   SpO2 98%   BMI 46.56 kg/m    Vitals:   09/21/18 1404  BP: 130/70  Pulse: 75  Resp: 16  Temp: 98 F (36.7 C)  TempSrc: Oral  SpO2: 98%  Weight: 246 lb 6.4 oz (111.8 kg)  Height: 5\' 1"  (1.549 m)     Physical Exam  Constitutional: She is oriented to person, place, and time. She appears well-developed and well-nourished. No distress.  HENT:  Head: Normocephalic and atraumatic.  Right Ear: Hearing, tympanic membrane, external ear and ear canal normal.  Left Ear: Hearing, tympanic membrane, external ear and ear canal normal.  Nose: Nose normal.  Mouth/Throat: Uvula is midline, oropharynx is clear and moist and mucous membranes are normal. No oropharyngeal exudate.  Eyes: Pupils are equal, round, and reactive to light. Conjunctivae and EOM are normal. Right eye exhibits no discharge. Left eye exhibits no discharge. No scleral icterus.  Neck: Normal range of motion. Neck supple. No JVD present. Carotid bruit is not present. No tracheal deviation present. No thyromegaly present.  Cardiovascular: Normal rate, regular rhythm, normal heart sounds and intact distal pulses. Exam reveals no gallop and no friction rub.  No murmur heard. Pulmonary/Chest: Effort normal and breath sounds normal. No respiratory distress. She has no wheezes. She has no rales. She exhibits no tenderness.  Abdominal: Soft. Bowel sounds are normal. She exhibits no distension and no mass. There is no tenderness. There is no rebound and no guarding.  Musculoskeletal: Normal range of motion. She exhibits no edema or tenderness.  Lymphadenopathy:    She has no cervical adenopathy.  Neurological: She is alert and oriented to person, place, and time.  Skin: Skin is warm and dry. No rash noted. She is not diaphoretic.  Psychiatric: She has a normal mood and  affect. Her behavior is normal. Judgment and thought content normal.  Vitals reviewed.    Depression Screen PHQ 2/9 Scores 09/21/2018 09/24/2016  PHQ - 2 Score 0 0      Assessment & Plan:     Routine Health Maintenance and Physical Exam  Exercise Activities and Dietary recommendations Goals    . Exercise 150 minutes per week (moderate activity)       Immunization History  Administered Date(s) Administered  . Influenza,inj,Quad PF,6+ Mos 08/23/2015  . Td 01/16/2005  . Tdap 12/31/2013    Health Maintenance  Topic Date Due  . PAP SMEAR  05/02/2018  . INFLUENZA VACCINE  02/20/2020 (Originally 07/02/2018)  . COLONOSCOPY  10/18/2021  . TETANUS/TDAP  01/01/2024  . HIV Screening  Addressed     Discussed health benefits of physical activity, and encouraged her to engage in regular exercise appropriate for her age and condition.    1. Annual physical exam Normal physical exam today. Will check labs as below and f/u pending lab results. If labs are stable and WNL she will not need to have these rechecked for one year at her next annual physical exam. She is to call the office in the meantime if she has any acute issue, questions or concerns. - CBC with Differential/Platelet - Comprehensive metabolic panel - Hemoglobin A1c - Lipid panel - TSH  2. Gastroesophageal reflux disease, esophagitis presence not specified Stable. Diagnosis pulled for medication refill. Continue current medical treatment plan. - pantoprazole (PROTONIX) 40 MG tablet; Take 1 tablet (40 mg total) by mouth daily.  Dispense: 90 tablet; Refill: 3  3. Menopausal symptoms New onset of early perimenopausal symptoms. Will check labs as below and f/u pending results. - Vitamin D (25 hydroxy) - FSH/LH  4. Biceps tendinitis on right Worsening. Discussed using IBU 600mg  TID or Aleve 440mg  BID. Exercises and stretches printed for patient. Call if worsening.    --------------------------------------------------------------------  Mar Daring, PA-C  [facility]

## 2018-09-22 LAB — COMPREHENSIVE METABOLIC PANEL
ALBUMIN: 4.1 g/dL (ref 3.5–5.5)
ALK PHOS: 60 IU/L (ref 39–117)
ALT: 14 IU/L (ref 0–32)
AST: 14 IU/L (ref 0–40)
Albumin/Globulin Ratio: 1.4 (ref 1.2–2.2)
BUN / CREAT RATIO: 13 (ref 9–23)
BUN: 10 mg/dL (ref 6–24)
Bilirubin Total: 0.5 mg/dL (ref 0.0–1.2)
CO2: 21 mmol/L (ref 20–29)
CREATININE: 0.8 mg/dL (ref 0.57–1.00)
Calcium: 9.4 mg/dL (ref 8.7–10.2)
Chloride: 102 mmol/L (ref 96–106)
GFR calc Af Amer: 102 mL/min/{1.73_m2} (ref >59)
GFR, EST NON AFRICAN AMERICAN: 89 mL/min/{1.73_m2} (ref >59)
GLOBULIN, TOTAL: 2.9 g/dL (ref 1.5–4.5)
Glucose: 88 mg/dL (ref 65–99)
Potassium: 4.1 mmol/L (ref 3.5–5.2)
Sodium: 139 mmol/L (ref 134–144)
TOTAL PROTEIN: 7 g/dL (ref 6.0–8.5)

## 2018-09-22 LAB — VITAMIN D 25 HYDROXY (VIT D DEFICIENCY, FRACTURES): Vit D, 25-Hydroxy: 22.5 ng/mL — ABNORMAL LOW (ref 30.0–100.0)

## 2018-09-22 LAB — CBC WITH DIFFERENTIAL/PLATELET
BASOS: 1 %
Basophils Absolute: 0 10*3/uL (ref 0.0–0.2)
EOS (ABSOLUTE): 0.1 10*3/uL (ref 0.0–0.4)
EOS: 3 %
HEMATOCRIT: 32.6 % — AB (ref 34.0–46.6)
HEMOGLOBIN: 9.9 g/dL — AB (ref 11.1–15.9)
Immature Grans (Abs): 0 10*3/uL (ref 0.0–0.1)
Immature Granulocytes: 0 %
Lymphocytes Absolute: 1.5 10*3/uL (ref 0.7–3.1)
Lymphs: 35 %
MCH: 20.8 pg — AB (ref 26.6–33.0)
MCHC: 30.4 g/dL — AB (ref 31.5–35.7)
MCV: 68 fL — AB (ref 79–97)
MONOCYTES: 9 %
Monocytes Absolute: 0.4 10*3/uL (ref 0.1–0.9)
NEUTROS ABS: 2.2 10*3/uL (ref 1.4–7.0)
Neutrophils: 52 %
Platelets: 356 10*3/uL (ref 150–450)
RBC: 4.77 x10E6/uL (ref 3.77–5.28)
RDW: 16.8 % — ABNORMAL HIGH (ref 12.3–15.4)
WBC: 4.2 10*3/uL (ref 3.4–10.8)

## 2018-09-22 LAB — HEMOGLOBIN A1C
Est. average glucose Bld gHb Est-mCnc: 131 mg/dL
HEMOGLOBIN A1C: 6.2 % — AB (ref 4.8–5.6)

## 2018-09-22 LAB — LIPID PANEL
CHOL/HDL RATIO: 3.4 ratio (ref 0.0–4.4)
Cholesterol, Total: 202 mg/dL — ABNORMAL HIGH (ref 100–199)
HDL: 59 mg/dL (ref >39)
LDL Calculated: 126 mg/dL — ABNORMAL HIGH (ref 0–99)
TRIGLYCERIDES: 84 mg/dL (ref 0–149)
VLDL CHOLESTEROL CAL: 17 mg/dL (ref 5–40)

## 2018-09-22 LAB — FSH/LH
FSH: 22.9 m[IU]/mL
LH: 22.8 m[IU]/mL

## 2018-09-22 LAB — TSH: TSH: 1.02 u[IU]/mL (ref 0.450–4.500)

## 2018-09-24 ENCOUNTER — Telehealth: Payer: Self-pay

## 2018-09-24 NOTE — Telephone Encounter (Signed)
-----   Message from [name], [place] sent at 09/24/2018  3:27 PM EDT ----- Her iron stores are low. It looks like she has uterine fibroids and was previously on ferrous sulfate 325 mg three times daily. Is she still taking this medication? If she is not, she can take supplement and follow up in one month for recheck. If she is, she should come into the clinic for a stool test to check for hidden blood.

## 2018-09-25 LAB — IRON,TIBC AND FERRITIN PANEL
Ferritin: 7 ng/mL — ABNORMAL LOW (ref 15–150)
IRON SATURATION: 10 % — AB (ref 15–55)
IRON: 40 ug/dL (ref 27–159)
Total Iron Binding Capacity: 417 ug/dL (ref 250–450)
UIBC: 377 ug/dL (ref 131–425)

## 2018-09-25 LAB — SPECIMEN STATUS REPORT

## 2018-09-30 DIAGNOSIS — H40003 Preglaucoma, unspecified, bilateral: Secondary | ICD-10-CM | POA: Diagnosis not present

## 2018-10-05 ENCOUNTER — Ambulatory Visit: Payer: 59 | Admitting: Obstetrics & Gynecology

## 2018-10-13 NOTE — Progress Notes (Signed)
Patient: Sarah Monroe Female    DOB: 20-Oct-1971   47 y.o.   MRN: [id number] Visit Date: 10/14/2018  Today's Provider: Mar Daring, PA-C   Chief Complaint  Patient presents with  . Follow-up    Iron   Subjective:    HPI  Iron, Follow up: Patient's last labs showed low iron. Patient was advised to start taking Ferrous Sulfate 325 mg three times daily. She reports excellent compliance with treatment. She is not having side effects. Reports that she is only doing the iron BID because when she was taking three it made her feel sick. She does report an improvement in her fatigue symptoms since starting the iron and Vit D supplements.   Lab Results  Component Value Date   IRON 40 09/21/2018   TIBC 417 09/21/2018   FERRITIN 7 (L) 09/21/2018   She does also report URI symptoms. Started just this morning. Having runny nose, sinus congestion, post nasal drainage and watery eye. No fever. No cough, SOB.    No Known Allergies   Current Outpatient Medications:  .  Cetirizine HCl (ZYRTEC ALLERGY) 10 MG CAPS, Take 10 mg by mouth daily as needed., Disp: , Rfl:  .  Cholecalciferol (VITAMIN D-3) 25 MCG (1000 UT) CAPS, Take by mouth., Disp: , Rfl:  .  ferrous sulfate 325 (65 FE) MG EC tablet, Take 325 mg by mouth 3 (three) times daily with meals., Disp: , Rfl:  .  pantoprazole (PROTONIX) 40 MG tablet, Take 1 tablet (40 mg total) by mouth daily., Disp: 90 tablet, Rfl: 3 .  acetaminophen (TYLENOL) 500 MG tablet, Take 500 mg by mouth every 4 (four) hours as needed., Disp: , Rfl:  .  nystatin (MYCOSTATIN) 100000 UNIT/ML suspension, Take 5 mLs by mouth 4 (four) times daily., Disp: , Rfl:   Review of Systems  Constitutional: Negative.   Respiratory: Negative.   Cardiovascular: Negative.   Gastrointestinal: Positive for constipation.  Neurological: Negative.     Social History   Tobacco Use  . Smoking status: Never Smoker  . Smokeless tobacco: Never Used  Substance Use  Topics  . Alcohol use: No    Comment: occassional   Objective:   BP 140/80 (BP Location: Left Wrist, Patient Position: Sitting, Cuff Size: Normal)   Pulse 62   Temp 98.3 F (36.8 C) (Oral)   Resp 16   Wt 252 lb 9.6 oz (114.6 kg)   SpO2 99%   BMI 47.73 kg/m  Vitals:   10/14/18 0817  BP: 140/80  Pulse: 62  Resp: 16  Temp: 98.3 F (36.8 C)  TempSrc: Oral  SpO2: 99%  Weight: 252 lb 9.6 oz (114.6 kg)     Physical Exam  Constitutional: She appears well-developed and well-nourished. No distress.  HENT:  Head: Normocephalic and atraumatic.  Right Ear: Hearing, tympanic membrane, external ear and ear canal normal.  Left Ear: Hearing, tympanic membrane, external ear and ear canal normal.  Nose: Nose normal.  Mouth/Throat: Uvula is midline, oropharynx is clear and moist and mucous membranes are normal. No oropharyngeal exudate.  Eyes: Pupils are equal, round, and reactive to light. Conjunctivae are normal. Right eye exhibits no discharge. Left eye exhibits no discharge. No scleral icterus.  Neck: Normal range of motion. Neck supple. No tracheal deviation present. No thyromegaly present.  Cardiovascular: Normal rate, regular rhythm and normal heart sounds. Exam reveals no gallop and no friction rub.  No murmur heard. Pulmonary/Chest: Effort normal and breath  sounds normal. No stridor. No respiratory distress. She has no wheezes. She has no rales.  Abdominal: Soft. Bowel sounds are normal. She exhibits no distension. There is no tenderness.  Lymphadenopathy:    She has no cervical adenopathy.  Skin: Skin is warm and dry. She is not diaphoretic.  Vitals reviewed.       Assessment & Plan:     1. Iron deficiency anemia secondary to inadequate dietary iron intake Will recheck labs as below. Continue ferrous sulfate 325mg  BID for now. Iron rish diet information printed for patient.  - Iron, TIBC and Ferritin Panel - CBC w/Diff/Platelet  2. Drug-induced constipation Secondary to  iron supplement. Advised to try Miralax prn.   3. Upper respiratory tract infection, unspecified type New onset just today. Advised to use Mucinex. Call if no improvements or symptoms worsen over next 7-10 days.       Mar Daring, PA-C  [facility]

## 2018-10-14 ENCOUNTER — Encounter: Payer: Self-pay | Admitting: Physician Assistant

## 2018-10-14 ENCOUNTER — Ambulatory Visit: Payer: 59 | Admitting: Physician Assistant

## 2018-10-14 VITALS — BP 140/80 | HR 62 | Temp 98.3°F | Resp 16 | Wt 252.6 lb

## 2018-10-14 DIAGNOSIS — D508 Other iron deficiency anemias: Secondary | ICD-10-CM | POA: Diagnosis not present

## 2018-10-14 DIAGNOSIS — K5903 Drug induced constipation: Secondary | ICD-10-CM | POA: Diagnosis not present

## 2018-10-14 DIAGNOSIS — J069 Acute upper respiratory infection, unspecified: Secondary | ICD-10-CM | POA: Diagnosis not present

## 2018-10-14 NOTE — Patient Instructions (Signed)
Iron-Rich Diet Iron is a mineral that helps your body to produce hemoglobin. Hemoglobin is a protein in your red blood cells that carries oxygen to your body's tissues. Eating too little iron may cause you to feel weak and tired, and it can increase your risk for infection. Eating enough iron is necessary for your body's metabolism, muscle function, and nervous system. Iron is naturally found in many foods. It can also be added to foods or fortified in foods. There are two types of dietary iron:  Heme iron. Heme iron is absorbed by the body more easily than nonheme iron. Heme iron is found in meat, poultry, and fish.  Nonheme iron. Nonheme iron is found in dietary supplements, iron-fortified grains, beans, and vegetables.  You may need to follow an iron-rich diet if:  You have been diagnosed with iron deficiency or iron-deficiency anemia.  You have a condition that prevents you from absorbing dietary iron, such as: ? Infection in your intestines. ? Celiac disease. This involves long-lasting (chronic) inflammation of your intestines.  You do not eat enough iron.  You eat a diet that is high in foods that impair iron absorption.  You have lost a lot of blood.  You have heavy bleeding during your menstrual cycle.  You are pregnant.  What is my plan? Your health care provider may help you to determine how much iron you need per day based on your condition. Generally, when a person consumes sufficient amounts of iron in the diet, the following iron needs are met:  Men. ? 14-18 years old: 11 mg per day. ? 19-50 years old: 8 mg per day.  Women. ? 14-18 years old: 15 mg per day. ? 19-50 years old: 18 mg per day. ? Over 50 years old: 8 mg per day. ? Pregnant women: 27 mg per day. ? Breastfeeding women: 9 mg per day.  What do I need to know about an iron-rich diet?  Eat fresh fruits and vegetables that are high in vitamin C along with foods that are high in iron. This will help  increase the amount of iron that your body absorbs from food, especially with foods containing nonheme iron. Foods that are high in vitamin C include oranges, peppers, tomatoes, and mango.  Take iron supplements only as directed by your health care provider. Overdose of iron can be life-threatening. If you were prescribed iron supplements, take them with orange juice or a vitamin C supplement.  Cook foods in pots and pans that are made from iron.  Eat nonheme iron-containing foods alongside foods that are high in heme iron. This helps to improve your iron absorption.  Certain foods and drinks contain compounds that impair iron absorption. Avoid eating these foods in the same meal as iron-rich foods or with iron supplements. These include: ? Coffee, black tea, and red wine. ? Milk, dairy products, and foods that are high in calcium. ? Beans, soybeans, and peas. ? Whole grains.  When eating foods that contain both nonheme iron and compounds that impair iron absorption, follow these tips to absorb iron better. ? Soak beans overnight before cooking. ? Soak whole grains overnight and drain them before using. ? Ferment flours before baking, such as using yeast in bread dough. What foods can I eat? Grains Iron-fortified breakfast cereal. Iron-fortified whole-wheat bread. Enriched rice. Sprouted grains. Vegetables Spinach. Potatoes with skin. Green peas. Broccoli. Red and green bell peppers. Fermented vegetables. Fruits Prunes. Raisins. Oranges. Strawberries. Mango. Grapefruit. Meats and Other Protein Sources   Beef liver. Oysters. Beef. Shrimp. [place]. Chicken. [place]. Sardines. Chickpeas. Nuts. Tofu. Beverages Tomato juice. Fresh orange juice. Prune juice. Hibiscus tea. Fortified instant breakfast shakes. Condiments Tahini. Fermented soy sauce. Sweets and Desserts Black-strap molasses. Other Wheat germ. The items listed above may not be a complete list of recommended foods or beverages.  Contact your dietitian for more options. What foods are not recommended? Grains Whole grains. Bran cereal. Bran flour. Oats. Vegetables Artichokes. Brussels sprouts. Kale. Fruits Blueberries. Raspberries. Strawberries. Figs. Meats and Other Protein Sources Soybeans. Products made from soy protein. Dairy Milk. Cream. Cheese. Yogurt. Cottage cheese. Beverages Coffee. Black tea. Red wine. Sweets and Desserts Cocoa. Chocolate. Ice cream. Other Basil. Oregano. Parsley. The items listed above may not be a complete list of foods and beverages to avoid. Contact your dietitian for more information. This information is not intended to replace advice given to you by your health care provider. Make sure you discuss any questions you have with your health care provider. Document Released: 07/02/2005 Document Revised: 06/07/2016 Document Reviewed: 06/15/2014 Elsevier Interactive Patient Education  [employer].

## 2018-10-15 ENCOUNTER — Telehealth: Payer: Self-pay

## 2018-10-15 ENCOUNTER — Encounter: Payer: Self-pay | Admitting: Obstetrics & Gynecology

## 2018-10-15 ENCOUNTER — Ambulatory Visit (INDEPENDENT_AMBULATORY_CARE_PROVIDER_SITE_OTHER): Payer: 59 | Admitting: Obstetrics & Gynecology

## 2018-10-15 ENCOUNTER — Other Ambulatory Visit (HOSPITAL_COMMUNITY)
Admission: RE | Admit: 2018-10-15 | Discharge: 2018-10-15 | Disposition: A | Discharge status: Home / Self Care | Payer: 59 | Source: Ambulatory Visit | Attending: Obstetrics & Gynecology | Admitting: Obstetrics & Gynecology

## 2018-10-15 VITALS — BP 122/80 | Ht 61.0 in | Wt 250.0 lb

## 2018-10-15 DIAGNOSIS — Z01419 Encounter for gynecological examination (general) (routine) without abnormal findings: Secondary | ICD-10-CM

## 2018-10-15 DIAGNOSIS — N921 Excessive and frequent menstruation with irregular cycle: Secondary | ICD-10-CM | POA: Insufficient documentation

## 2018-10-15 DIAGNOSIS — Z0000 Encounter for general adult medical examination without abnormal findings: Secondary | ICD-10-CM

## 2018-10-15 DIAGNOSIS — D219 Benign neoplasm of connective and other soft tissue, unspecified: Secondary | ICD-10-CM | POA: Insufficient documentation

## 2018-10-15 DIAGNOSIS — Z1239 Encounter for other screening for malignant neoplasm of breast: Secondary | ICD-10-CM

## 2018-10-15 DIAGNOSIS — Z124 Encounter for screening for malignant neoplasm of cervix: Secondary | ICD-10-CM

## 2018-10-15 LAB — CBC WITH DIFFERENTIAL/PLATELET
BASOS ABS: 0 10*3/uL (ref 0.0–0.2)
Basos: 1 %
EOS (ABSOLUTE): 0.1 10*3/uL (ref 0.0–0.4)
EOS: 3 %
Hematocrit: 35.8 % (ref 34.0–46.6)
Hemoglobin: 10.9 g/dL — ABNORMAL LOW (ref 11.1–15.9)
Immature Grans (Abs): 0 10*3/uL (ref 0.0–0.1)
Immature Granulocytes: 0 %
Lymphocytes Absolute: 1.1 10*3/uL (ref 0.7–3.1)
Lymphs: 25 %
MCH: 22.7 pg — ABNORMAL LOW (ref 26.6–33.0)
MCHC: 30.4 g/dL — AB (ref 31.5–35.7)
MCV: 74 fL — ABNORMAL LOW (ref 79–97)
MONOCYTES: 8 %
MONOS ABS: 0.3 10*3/uL (ref 0.1–0.9)
NEUTROS PCT: 63 %
Neutrophils Absolute: 2.7 10*3/uL (ref 1.4–7.0)
PLATELETS: 325 10*3/uL (ref 150–450)
RBC: 4.81 x10E6/uL (ref 3.77–5.28)
RDW: 22.2 % — AB (ref 12.3–15.4)
WBC: 4.3 10*3/uL (ref 3.4–10.8)

## 2018-10-15 LAB — IRON,TIBC AND FERRITIN PANEL
Ferritin: 37 ng/mL (ref 15–150)
Iron Saturation: 12 % — ABNORMAL LOW (ref 15–55)
Iron: 42 ug/dL (ref 27–159)
Total Iron Binding Capacity: 348 ug/dL (ref 250–450)
UIBC: 306 ug/dL (ref 131–425)

## 2018-10-15 MED ORDER — NORETHINDRONE 0.35 MG PO TABS: 1.0000 | ORAL_TABLET | Freq: Every day | ORAL | 11 refills | Status: DC

## 2018-10-15 NOTE — Patient Instructions (Signed)
PAP every three years Mammogram every year    Call [phone] to schedule at [facility] yearly (with PCP)   Total Laparoscopic Hysterectomy A total laparoscopic hysterectomy is a minimally invasive surgery to remove your uterus and cervix. This surgery is performed by making several small cuts (incisions) in your abdomen. It can also be done with a thin, lighted tube (laparoscope) inserted into two small incisions in your lower abdomen. Your fallopian tubes and ovaries can be removed (bilateral salpingo-oophorectomy) during this surgery as well.Benefits of minimally invasive surgery include:  Less pain.  Less risk of blood loss.  Less risk of infection.  Quicker return to normal activities.  Tell a health care provider about:  Any allergies you have.  All medicines you are taking, including vitamins, herbs, eye drops, creams, and over-the-counter medicines.  Any problems you or family members have had with anesthetic medicines.  Any blood disorders you have.  Any surgeries you have had.  Any medical conditions you have. What are the risks? Generally, this is a safe procedure. However, as with any procedure, complications can occur. Possible complications include:  Bleeding.  Blood clots in the legs or lung.  Infection.  Injury to surrounding organs.  Problems with anesthesia.  Early menopause symptoms (hot flashes, night sweats, insomnia).  Risk of conversion to an open abdominal incision.  What happens before the procedure?  Ask your health care provider about changing or stopping your regular medicines.  Do not take aspirin or blood thinners (anticoagulants) for 1 week before the surgery or as told by your health care provider.  Do not eat or drink anything for 8 hours before the surgery or as told by your health care provider.  Quit smoking if you smoke.  Arrange for a ride home after surgery and for someone to help you at home during recovery. What  happens during the procedure?  You will be given antibiotic medicine.  An IV tube will be placed in your arm. You will be given medicine to make you sleep (general anesthetic).  A gas (carbon dioxide) will be used to inflate your abdomen. This will allow your surgeon to look inside your abdomen, perform your surgery, and treat any other problems found if necessary.  Three or four small incisions (often less than 1/2 inch) will be made in your abdomen. One of these incisions will be made in the area of your belly button (navel). The laparoscope will be inserted into the incision. Your surgeon will look through the laparoscope while doing your procedure.  Other surgical instruments will be inserted through the other incisions.  Your uterus may be removed through your vagina or cut into small pieces and removed through the small incisions.  Your incisions will be closed. What happens after the procedure?  The gas will be released from inside your abdomen.  You will be taken to the recovery area where a nurse will watch and check your progress. Once you are awake, stable, and taking fluids well, without other problems, you will return to your room or be allowed to go home.  There is usually minimal discomfort following the surgery because the incisions are so small.  You will be given pain medicine while you are in the hospital and for when you go home. This information is not intended to replace advice given to you by your health care provider. Make sure you discuss any questions you have with your health care provider. Document Released: 09/15/2007 Document Revised: 04/25/2016 Document Reviewed:  06/08/2013 Elsevier Interactive Patient Education  2017 [employer].

## 2018-10-15 NOTE — Progress Notes (Signed)
HPI:      [name] is a 47 y.o. G3P3 who LMP was Patient's last menstrual period was 09/27/2018., she presents today for her annual examination. The patient has no complaints today other than symptomatic anemia w periods occurring every 2-6 weeks and heavy most times.  Has anemia and takes iron.  Also has occal hot flash. The patient is sexually active. Her last pap: approximate date 2016 and was normal and last mammogram: approximate date 2017 and was normal. The patient does perform self breast exams.  There is no notable family history of breast or ovarian cancer in her family.  The patient has regular exercise: yes.  The patient denies current symptoms of depression.    GYN History: Contraception: tubal ligation  PMHx: Past Medical History:  Diagnosis Date  . Anemia   . Anxiety   . Hyperlipidemia    Past Surgical History:  Procedure Laterality Date  . CESAREAN SECTION     x3  . COLONOSCOPY WITH PROPOFOL N/A 10/18/2016   Procedure: COLONOSCOPY WITH PROPOFOL;  Surgeon: Jonathon Bellows, MD;  Location: [facility] ENDOSCOPY;  Service: Endoscopy;  Laterality: N/A;  . Endometriotic cyst     Dr. Jamal Collin  . GALLBLADDER SURGERY  1999   Dr. Kirtland Bouchard  . Post cholcystectomy  1998  . TUBAL LIGATION Bilateral    Family History  Problem Relation Age of Onset  . Allergies Mother   . Diabetes Mother        History of DM  . Hyperlipidemia Mother   . Hypertension Mother        But is well controlled due to diet/exercise  . Colon polyps Mother   . Hypertension Father   . Diabetes Father        Type 2  . Hernia Father   . Heart murmur Father   . Colon cancer Cousin 70  . Breast cancer Neg Hx    Social History   Tobacco Use  . Smoking status: Never Smoker  . Smokeless tobacco: Never Used  Substance Use Topics  . Alcohol use: No    Comment: occassional  . Drug use: No    Current Outpatient Medications:  .  Cholecalciferol (VITAMIN D-3) 25 MCG (1000 UT) CAPS, Take by mouth.,  Disp: , Rfl:  .  ferrous sulfate 325 (65 FE) MG EC tablet, Take 325 mg by mouth 3 (three) times daily with meals., Disp: , Rfl:  .  pantoprazole (PROTONIX) 40 MG tablet, Take 1 tablet (40 mg total) by mouth daily., Disp: 90 tablet, Rfl: 3 .  acetaminophen (TYLENOL) 500 MG tablet, Take 500 mg by mouth every 4 (four) hours as needed., Disp: , Rfl:  .  Cetirizine HCl (ZYRTEC ALLERGY) 10 MG CAPS, Take 10 mg by mouth daily as needed., Disp: , Rfl:  .  nystatin (MYCOSTATIN) 100000 UNIT/ML suspension, Take 5 mLs by mouth 4 (four) times daily., Disp: , Rfl:  Allergies: Patient has no known allergies.  Review of Systems  Constitutional: Positive for malaise/fatigue. Negative for chills and fever.  HENT: Negative for congestion, sinus pain and sore throat.   Eyes: Negative for blurred vision and pain.  Respiratory: Negative for cough and wheezing.   Cardiovascular: Negative for chest pain and leg swelling.  Gastrointestinal: Positive for constipation. Negative for abdominal pain, diarrhea, heartburn, nausea and vomiting.  Genitourinary: Negative for dysuria, frequency, hematuria and urgency.  Musculoskeletal: Negative for back pain, joint pain, myalgias and neck pain.  Skin: Negative for itching and  rash.  Neurological: Negative for dizziness, tremors and weakness.  Endo/Heme/Allergies: Bruises/bleeds easily.  Psychiatric/Behavioral: Negative for depression. The patient is not nervous/anxious and does not have insomnia.     Objective: BP 122/80   Ht 5\' 1"  (1.549 m)   Wt 250 lb (113.4 kg)   LMP 09/27/2018   BMI 47.24 kg/m   Filed Weights   10/15/18 1405  Weight: 250 lb (113.4 kg)   Body mass index is 47.24 kg/m. Physical Exam  Constitutional: She is oriented to person, place, and time. She appears well-developed and well-nourished. No distress.  Genitourinary: Rectum normal and vagina normal. Pelvic exam was performed with patient supine. There is no rash or lesion on the right labia. There  is no rash or lesion on the left labia. Vagina exhibits no lesion. No bleeding in the vagina. Right adnexum does not display mass and does not display tenderness. Left adnexum does not display mass and does not display tenderness. Cervix does not exhibit motion tenderness, lesion, friability or polyp.   Uterus is enlarged, mobile and midaxial. Uterus is not exhibiting a mass.  Genitourinary Comments: 6 week size uterus No adnexal mass  HENT:  Head: Normocephalic and atraumatic. Head is without laceration.  Right Ear: Hearing normal.  Left Ear: Hearing normal.  Nose: No epistaxis.  No foreign bodies.  Mouth/Throat: Uvula is midline, oropharynx is clear and moist and mucous membranes are normal.  Eyes: Pupils are equal, round, and reactive to light.  Neck: Normal range of motion. Neck supple. No thyromegaly present.  Cardiovascular: Normal rate and regular rhythm. Exam reveals no gallop and no friction rub.  No murmur heard. Pulmonary/Chest: Effort normal and breath sounds normal. No respiratory distress. She has no wheezes. Right breast exhibits no mass, no skin change and no tenderness. Left breast exhibits no mass, no skin change and no tenderness.  Abdominal: Soft. Bowel sounds are normal. She exhibits no distension. There is no tenderness. There is no rebound.  Musculoskeletal: Normal range of motion.  Neurological: She is alert and oriented to person, place, and time. No cranial nerve deficit.  Skin: Skin is warm and dry.  Psychiatric: She has a normal mood and affect. Judgment normal.  Vitals reviewed.   Assessment:  ANNUAL EXAM 1. Annual physical exam   2. Screening for breast cancer   3. Screening for cervical cancer   4. Fibroids   5. Menometrorrhagia    Screening Plan:            1.  Cervical Screening-  Pap smear done today  2. Breast screening- Exam annually and mammogram>40 planned   3. Colonoscopy every 10 years, Hemoccult testing - after age 57  4. Labs managed by  PCP  5. Counseling for contraception: Plan prog only OCP for period control, vs hysterectomy (pt has h/o fibroids). '   6. Fibroids - US PELVIS TRANSVANGINAL NON-OB (TV ONLY); Future  7. Menometrorrhagia - US PELVIS TRANSVANGINAL NON-OB (TV ONLY); Future    F/U  Return in about 1 week (around 10/22/2018) for Follow up w GYN [place].  Barnett Applebaum, MD, [facility], [facility] 10/15/2018  2:41 PM

## 2018-10-15 NOTE — Telephone Encounter (Signed)
Patient was advised.  

## 2018-10-15 NOTE — Telephone Encounter (Signed)
-----   Message from Mar Daring, PA-C sent at 10/15/2018  2:02 PM EST ----- Hemoglobin and iron stores are improving but slowly. I would recommend to continue ferrous sulfate 325mg  BID (or ferrous gluconate-may help lessen GI side effects) at this time. We can recheck in 3 months.

## 2018-10-20 LAB — CYTOLOGY - PAP
ADEQUACY: ABSENT
Diagnosis: NEGATIVE
HPV (WINDOPATH): NOT DETECTED

## 2018-10-26 ENCOUNTER — Other Ambulatory Visit: Payer: 59

## 2018-10-26 ENCOUNTER — Ambulatory Visit: Payer: 59 | Admitting: Obstetrics & Gynecology

## 2018-11-04 ENCOUNTER — Telehealth: Payer: Self-pay

## 2018-11-04 NOTE — Telephone Encounter (Signed)
Pt calling for results of her exam c Sarah Monroe.  [phone]  Pt aware pap smear is negative.

## 2018-11-26 ENCOUNTER — Ambulatory Visit
Admission: RE | Admit: 2018-11-26 | Discharge: 2018-11-26 | Disposition: A | Discharge status: Home / Self Care | Payer: 59 | Source: Ambulatory Visit | Attending: Obstetrics & Gynecology | Admitting: Obstetrics & Gynecology

## 2018-11-26 DIAGNOSIS — Z1239 Encounter for other screening for malignant neoplasm of breast: Secondary | ICD-10-CM | POA: Diagnosis present

## 2018-11-26 DIAGNOSIS — Z1231 Encounter for screening mammogram for malignant neoplasm of breast: Secondary | ICD-10-CM | POA: Diagnosis not present

## 2019-06-21 ENCOUNTER — Telehealth: Payer: Self-pay | Admitting: Physician Assistant

## 2019-06-21 NOTE — Telephone Encounter (Signed)
Please advise 

## 2019-06-21 NOTE — Telephone Encounter (Signed)
Knot under center of chin.  Soft,  No pain  CB# [phone]  Con [place]

## 2019-06-21 NOTE — Telephone Encounter (Signed)
Most likely salivary gland possible salivary stone. Push fluids. Sour hard candies or sucking on lemons can also help increase salivation. Call ig not improving and may require evaluation.

## 2019-06-22 NOTE — Telephone Encounter (Signed)
Patient was advised.  

## 2019-07-12 ENCOUNTER — Telehealth: Payer: Self-pay

## 2019-07-12 NOTE — Telephone Encounter (Signed)
Encounter error

## 2019-07-14 NOTE — Progress Notes (Signed)
Patient: Sarah Monroe Female    DOB: 09/23/1971   48 y.o.   MRN: [id number] Visit Date: 07/15/2019  Today's Provider: Mar Daring, PA-C   Chief Complaint  Patient presents with  . Cyst   Subjective:     HPI  Patient here with c/o of a knot under her chin for 2 weeks. Reports that is not changing in size. It doesn't hurt. Treatments:warm compress and sucking on lemon and sour candy and plenty of water. No fever, no pain when she swallows. Reports that her throat was hurting and it was allergies because it got better with her allergy medication. This was like 3 weeks ago.  No Known Allergies   Current Outpatient Medications:  .  Cetirizine HCl (ZYRTEC ALLERGY) 10 MG CAPS, Take 10 mg by mouth daily as needed., Disp: , Rfl:  .  Cholecalciferol (VITAMIN D-3) 25 MCG (1000 UT) CAPS, Take by mouth., Disp: , Rfl:  .  ferrous sulfate 325 (65 FE) MG EC tablet, Take 325 mg by mouth 3 (three) times daily with meals., Disp: , Rfl:  .  pantoprazole (PROTONIX) 40 MG tablet, Take 1 tablet (40 mg total) by mouth daily., Disp: 90 tablet, Rfl: 3  Review of Systems  Constitutional: Negative for appetite change, chills, fatigue and fever.  HENT: Negative for congestion, mouth sores, sore throat and trouble swallowing.   Respiratory: Negative for chest tightness and shortness of breath.   Cardiovascular: Negative for chest pain and palpitations.  Gastrointestinal: Negative for abdominal pain, nausea and vomiting.  Neurological: Negative for dizziness and weakness.    Social History   Tobacco Use  . Smoking status: Never Smoker  . Smokeless tobacco: Never Used  Substance Use Topics  . Alcohol use: No    Comment: occassional      Objective:   BP (!) 145/88 (BP Location: Left Arm, Patient Position: Sitting, Cuff Size: Large)   Pulse 65   Temp 98.7 F (37.1 C) (Oral)   Resp 16   Wt 256 lb (116.1 kg)   BMI 48.37 kg/m  Vitals:   07/15/19 1414  BP: (!) 145/88  Pulse: 65   Resp: 16  Temp: 98.7 F (37.1 C)  TempSrc: Oral  Weight: 256 lb (116.1 kg)     Physical Exam Vitals signs reviewed.  Constitutional:      General: She is not in acute distress.    Appearance: Normal appearance. She is well-developed. She is obese. She is not ill-appearing or diaphoretic.  HENT:     Head: Normocephalic and atraumatic.      Right Ear: Hearing, tympanic membrane, ear canal and external ear normal.     Left Ear: Hearing, tympanic membrane, ear canal and external ear normal.     Nose: Nose normal.     Mouth/Throat:     Lips: Pink.     Mouth: Mucous membranes are moist.     Pharynx: Oropharynx is clear. Uvula midline. No oropharyngeal exudate.  Eyes:     General: No scleral icterus.       Right eye: No discharge.        Left eye: No discharge.     Conjunctiva/sclera: Conjunctivae normal.     Pupils: Pupils are equal, round, and reactive to light.  Neck:     Musculoskeletal: Normal range of motion and neck supple.     Thyroid: No thyromegaly.     Trachea: No tracheal deviation.  Cardiovascular:  Rate and Rhythm: Normal rate and regular rhythm.     Heart sounds: Normal heart sounds. No murmur. No friction rub. No gallop.   Pulmonary:     Effort: Pulmonary effort is normal. No respiratory distress.     Breath sounds: Normal breath sounds. No stridor. No wheezing or rales.  Lymphadenopathy:     Cervical: No cervical adenopathy.  Skin:    General: Skin is warm and dry.  Neurological:     Mental Status: She is alert.      No results found for any visits on 07/15/19.     Assessment & Plan    1. Submandibular gland swelling DDx: salivary stone, salivary gland infection, salivary gland cancer. Will treat with amoxil and prednisone as below for possible infection. [place] ordered for further evaluation. ENT referral also placed for further treatment considerations pending [place] results.  - US Soft Tissue Head/Neck; Future - Ambulatory referral to ENT -  predniSONE (DELTASONE) 20 MG tablet; Take 1 tablet (20 mg total) by mouth daily with breakfast.  Dispense: 5 tablet; Refill: 0 - amoxicillin (AMOXIL) 875 MG tablet; Take 1 tablet (875 mg total) by mouth 2 (two) times daily.  Dispense: 10 tablet; Refill: 0     Mar Daring, PA-C  [facility]

## 2019-07-15 ENCOUNTER — Other Ambulatory Visit: Payer: Self-pay

## 2019-07-15 ENCOUNTER — Ambulatory Visit: Payer: 59 | Admitting: Physician Assistant

## 2019-07-15 ENCOUNTER — Encounter: Payer: Self-pay | Admitting: Physician Assistant

## 2019-07-15 VITALS — BP 145/88 | HR 65 | Temp 98.7°F | Resp 16 | Wt 256.0 lb

## 2019-07-15 DIAGNOSIS — R609 Edema, unspecified: Secondary | ICD-10-CM | POA: Diagnosis not present

## 2019-07-15 MED ORDER — AMOXICILLIN 875 MG PO TABS: 875.0000 mg | ORAL_TABLET | Freq: Two times a day (BID) | ORAL | 0 refills | Status: DC

## 2019-07-15 MED ORDER — PREDNISONE 20 MG PO TABS: 20.0000 mg | ORAL_TABLET | Freq: Every day | ORAL | 0 refills | Status: DC

## 2019-07-15 NOTE — Patient Instructions (Signed)
Salivary Gland Infection  A salivary gland infection is an infection in one or more of the glands that produce saliva. You have six major salivary glands. Each gland has a duct that carries saliva into your mouth. Saliva keeps your mouth moist and breaks down the food that you eat. It also helps prevent tooth decay. Two salivary glands are located just in front of your ears (parotid). The ducts for these glands open up inside your cheeks, near your back teeth. You also have two glands under your tongue (sublingual) and two glands under your jaw (submandibular). The ducts for these glands open under your tongue. Any salivary gland can become infected. Most infections occur in the parotid glands or submandibular glands. What are the causes? This condition may be caused by bacteria or viruses.  The bacteria that cause salivary gland infections are usually the same bacteria that normally live in your mouth. ? A stone can form in a salivary gland and block the flow of saliva. As a result, saliva backs up into the salivary gland. Bacteria may then start to grow behind the blockage and cause infection. ? Bacterial infections usually cause pain and swelling on one side of the face. Submandibular gland swelling occurs under the jaw. Parotid swelling occurs in front of the ear. ? Bacterial infections are more common in adults.  The mumps virus is the most common cause of viral salivary gland infections. However, mumps is now rare because of vaccination. ? This infection causes swelling in both parotid glands. ? Viral infections are more common in children. What increases the risk? The following factors may make you more likely to develop a bacterial infection:  Not taking good care of your mouth and teeth (poor oral hygiene).  Smoking.  Not drinking enough water.  Having a disease that causes dry mouth and dry eyes (Mikulicz syndrome or Sjogren syndrome). A viral infection is more likely to occur in  children who do not get the MMR (measles, mumps, rubella) vaccine. What are the signs or symptoms? The main sign of a salivary gland infection is a swollen salivary gland. This type of inflammation is often called sialadenitis. You may have swelling in front of your ear, under your jaw, or under your tongue. Swelling may get worse when you eat and decrease after you eat. Other signs and symptoms include:  Pain.  Tenderness.  Redness.  Dry mouth.  Bad taste in your mouth.  Difficulty chewing and swallowing.  Fever. How is this diagnosed? This condition may be diagnosed based on:  Your signs and symptoms.  A physical exam. During the exam, your health care provider will look and feel inside your mouth to see whether a stone is blocking a salivary gland duct.  Tests, such as: ? An X-ray to check for a stone. ? An ultrasound, CT scan, or MRI to look for an abscess and to rule out other causes of swelling. ? A culture and sensitivity test. In this test, a sample of pus is taken from the salivary gland with a swab or by using a needle (aspiration). The sample is tested in a lab to determine the type of bacteria that is growing and which antibiotic medicines will work against it. You may need to see an ear, nose, and throat specialist (ENT or otolaryngologist) for diagnosis and treatment. How is this treated? Viral salivary gland infections usually clear up without treatment. Bacterial infections are usually treated with antibiotic medicine. Severe infections that cause difficulty with swallowing   may be treated with an IV antibiotic in the hospital. Other treatments may include:  Probing and widening the salivary duct to allow a stone to pass. In some cases, a thin, flexible scope (endoscope) may be inserted into the duct to find a stone and remove it.  Breaking up a stone using sound waves.  Draining an infected gland (abscess) with a needle.  Surgery to: ? Remove a stone. ? Drain  pus from an abscess. ? Remove a badly infected gland. Follow these instructions at home:  Medicines  Take over-the-counter and prescription medicines only as told by your health care provider.  If you were prescribed an antibiotic medicine, take it as told by your health care provider. Do not stop taking the antibiotic even if you start to feel better. To relieve discomfort  Follow these instructions every few hours: ? Suck on a lemon candy to stimulate the flow of saliva. ? Put a warm compress over the gland. ? Gently massage the gland.  Rinse your mouth with a salt-water mixture 3-4 times a day or as needed. To make a salt-water mixture, completely dissolve -1 tsp of salt in 1 cup of warm water. General instructions  Practice good oral hygiene by brushing and flossing your teeth after meals and before you go to bed.  Drink enough fluid to keep your urine pale yellow.  Do not use any products that contain nicotine or tobacco, such as cigarettes and e-cigarettes. If you need help quitting, ask your health care provider.  Keep all follow-up visits as told by your health care provider. This is important. Contact a health care provider if:  You have pain and swelling in your face, jaw, or mouth after eating.  You have persistent swelling in any of these places: ? In front of your ear. ? Under your jaw. ? Inside your mouth. Get help right away if:  You have pain and swelling in your face, jaw, or mouth, and this is getting worse.  Your pain and swelling make it hard to swallow or breathe. Summary  A salivary gland infection is an infection in one or more of the glands that produce saliva. You have six major salivary glands. Each gland has a duct that carries saliva into your mouth.  Any salivary gland can become infected. Most infections occur in the glands just in front of your ears (parotid glands) or the glands under your jaw (submandibular glands).  This condition may be  caused by bacteria or viruses.  Salivary gland infections caused by a virus usually clear up without treatment. Bacterial infections are usually treated with antibiotic medicine. This information is not intended to replace advice given to you by your health care provider. Make sure you discuss any questions you have with your health care provider. Document Released: 12/26/2004 Document Revised: 12/17/2017 Document Reviewed: 12/17/2017 Elsevier Patient Education  2020 [employer].

## 2019-07-21 ENCOUNTER — Ambulatory Visit
Admission: RE | Admit: 2019-07-21 | Discharge: 2019-07-21 | Disposition: A | Discharge status: Home / Self Care | Payer: 59 | Source: Ambulatory Visit | Attending: Physician Assistant | Admitting: Physician Assistant

## 2019-07-21 ENCOUNTER — Other Ambulatory Visit: Payer: Self-pay

## 2019-07-21 DIAGNOSIS — R609 Edema, unspecified: Secondary | ICD-10-CM | POA: Insufficient documentation

## 2019-07-26 ENCOUNTER — Other Ambulatory Visit (HOSPITAL_COMMUNITY): Payer: Self-pay | Admitting: Otolaryngology

## 2019-07-26 ENCOUNTER — Other Ambulatory Visit: Payer: Self-pay | Admitting: Otolaryngology

## 2019-07-26 DIAGNOSIS — R221 Localized swelling, mass and lump, neck: Secondary | ICD-10-CM

## 2019-08-02 ENCOUNTER — Ambulatory Visit
Admission: RE | Admit: 2019-08-02 | Discharge: 2019-08-02 | Disposition: A | Discharge status: Home / Self Care | Payer: 59 | Source: Ambulatory Visit | Attending: Otolaryngology | Admitting: Otolaryngology

## 2019-08-02 ENCOUNTER — Other Ambulatory Visit: Payer: Self-pay

## 2019-08-02 DIAGNOSIS — R221 Localized swelling, mass and lump, neck: Secondary | ICD-10-CM | POA: Insufficient documentation

## 2019-08-02 MED ORDER — IOHEXOL 300 MG/ML  SOLN
75.0000 mL | Freq: Once | INTRAMUSCULAR | Status: AC | PRN
  Administered 2019-08-02: 75 mL via INTRAVENOUS

## 2019-08-05 ENCOUNTER — Other Ambulatory Visit: Payer: Self-pay | Admitting: Otolaryngology

## 2019-08-05 DIAGNOSIS — R221 Localized swelling, mass and lump, neck: Secondary | ICD-10-CM

## 2019-08-12 ENCOUNTER — Other Ambulatory Visit: Payer: Self-pay | Admitting: Radiology

## 2019-08-13 ENCOUNTER — Other Ambulatory Visit: Payer: Self-pay

## 2019-08-13 ENCOUNTER — Other Ambulatory Visit: Payer: Self-pay | Admitting: Otolaryngology

## 2019-08-13 ENCOUNTER — Ambulatory Visit
Admission: RE | Admit: 2019-08-13 | Discharge: 2019-08-13 | Disposition: A | Discharge status: Home / Self Care | Payer: 59 | Source: Ambulatory Visit | Attending: Otolaryngology | Admitting: Otolaryngology

## 2019-08-13 DIAGNOSIS — Z6841 Body Mass Index (BMI) 40.0 and over, adult: Secondary | ICD-10-CM | POA: Insufficient documentation

## 2019-08-13 DIAGNOSIS — E669 Obesity, unspecified: Secondary | ICD-10-CM | POA: Insufficient documentation

## 2019-08-13 DIAGNOSIS — R221 Localized swelling, mass and lump, neck: Secondary | ICD-10-CM

## 2019-08-13 DIAGNOSIS — E785 Hyperlipidemia, unspecified: Secondary | ICD-10-CM | POA: Insufficient documentation

## 2019-08-13 DIAGNOSIS — Z79899 Other long term (current) drug therapy: Secondary | ICD-10-CM | POA: Insufficient documentation

## 2019-08-13 DIAGNOSIS — K219 Gastro-esophageal reflux disease without esophagitis: Secondary | ICD-10-CM | POA: Insufficient documentation

## 2019-08-13 DIAGNOSIS — R591 Generalized enlarged lymph nodes: Secondary | ICD-10-CM | POA: Insufficient documentation

## 2019-08-13 HISTORY — DX: Gastro-esophageal reflux disease without esophagitis: K21.9

## 2019-08-13 HISTORY — DX: Prediabetes: R73.03

## 2019-08-13 LAB — CBC WITH DIFFERENTIAL/PLATELET
Abs Immature Granulocytes: 0.03 10*3/uL (ref 0.00–0.07)
Basophils Absolute: 0 10*3/uL (ref 0.0–0.1)
Basophils Relative: 0 %
Eosinophils Absolute: 0.2 10*3/uL (ref 0.0–0.5)
Eosinophils Relative: 4 %
HCT: 38 % (ref 36.0–46.0)
Hemoglobin: 12.2 g/dL (ref 12.0–15.0)
Immature Granulocytes: 1 %
Lymphocytes Relative: 26 %
Lymphs Abs: 1.2 10*3/uL (ref 0.7–4.0)
MCH: 26.5 pg (ref 26.0–34.0)
MCHC: 32.1 g/dL (ref 30.0–36.0)
MCV: 82.6 fL (ref 80.0–100.0)
Monocytes Absolute: 0.4 10*3/uL (ref 0.1–1.0)
Monocytes Relative: 8 %
Neutro Abs: 2.8 10*3/uL (ref 1.7–7.7)
Neutrophils Relative %: 61 %
Platelets: 281 10*3/uL (ref 150–400)
RBC: 4.6 MIL/uL (ref 3.87–5.11)
RDW: 14.3 % (ref 11.5–15.5)
WBC: 4.6 10*3/uL (ref 4.0–10.5)
nRBC: 0 % (ref 0.0–0.2)

## 2019-08-13 LAB — BASIC METABOLIC PANEL
Anion gap: 8 (ref 5–15)
BUN: 11 mg/dL (ref 6–20)
CO2: 26 mmol/L (ref 22–32)
Calcium: 9.4 mg/dL (ref 8.9–10.3)
Chloride: 106 mmol/L (ref 98–111)
Creatinine, Ser: 0.72 mg/dL (ref 0.44–1.00)
GFR calc Af Amer: >60 mL/min (ref >60)
GFR calc non Af Amer: >60 mL/min (ref >60)
Glucose, Bld: 112 mg/dL — ABNORMAL HIGH (ref 70–99)
Potassium: 3.7 mmol/L (ref 3.5–5.1)
Sodium: 140 mmol/L (ref 135–145)

## 2019-08-13 LAB — PROTIME-INR
INR: 1 (ref 0.8–1.2)
Prothrombin Time: 12.6 seconds (ref 11.4–15.2)

## 2019-08-13 MED ORDER — SODIUM CHLORIDE 0.9 % IV SOLN
INTRAVENOUS | Status: DC
  Administered 2019-08-13: 11:00:00 via INTRAVENOUS

## 2019-08-13 MED ORDER — FENTANYL CITRATE (PF) 100 MCG/2ML IJ SOLN
INTRAMUSCULAR | Status: AC | PRN
  Administered 2019-08-13 (×2): 25 ug via INTRAVENOUS

## 2019-08-13 MED ORDER — FENTANYL CITRATE (PF) 100 MCG/2ML IJ SOLN
INTRAMUSCULAR | Status: AC
  Filled 2019-08-13: qty 4

## 2019-08-13 MED ORDER — MIDAZOLAM HCL 2 MG/2ML IJ SOLN
INTRAMUSCULAR | Status: AC | PRN
  Administered 2019-08-13: 1 mg via INTRAVENOUS

## 2019-08-13 MED ORDER — MIDAZOLAM HCL 2 MG/2ML IJ SOLN
INTRAMUSCULAR | Status: AC
  Filled 2019-08-13: qty 4

## 2019-08-13 NOTE — Consult Note (Signed)
Chief Complaint: Indeterminate submental and right supraclavicular adenopathy   Referring Physician(s): Bennett,Paul  Patient Status: [facility] - Out-pt  History of Present Illness: Sarah Monroe is a 48 y.o. female with past medical history significant for hyperlipidemia, GERD and obesity who presents today for ultrasound-guided biopsy of dominant palpable right-sided sub-mental lymph node.  The patient is unaccompanied and serves his own historian.  Patient remains in her baseline state of well health.  Specifically, no fever or chills.  No change in appetite or energy level.  No chest pain or shortness of breath.  Past Medical History:  Diagnosis Date   Anemia    Anxiety    GERD (gastroesophageal reflux disease)    Hyperlipidemia    Pre-diabetes     Past Surgical History:  Procedure Laterality Date   CESAREAN SECTION     x3   COLONOSCOPY WITH PROPOFOL N/A 10/18/2016   Procedure: COLONOSCOPY WITH PROPOFOL;  Surgeon: Jonathon Bellows, MD;  Location: [facility] ENDOSCOPY;  Service: Endoscopy;  Laterality: N/A;   Endometriotic cyst     Dr. Jamal Collin   GALLBLADDER SURGERY  1999   Dr. Kirtland Bouchard   Post cholcystectomy  1998   TUBAL LIGATION Bilateral     Allergies: Patient has no known allergies.  Medications: Prior to Admission medications   Medication Sig Start Date End Date Taking? Authorizing Provider  Cetirizine HCl (ZYRTEC ALLERGY) 10 MG CAPS Take 10 mg by mouth daily as needed. 01/31/11  Yes [provider]  Cholecalciferol (VITAMIN D-3) 25 MCG (1000 UT) CAPS Take by mouth.   Yes [provider]  ferrous sulfate 325 (65 FE) MG EC tablet Take 325 mg by mouth 3 (three) times daily with meals.   Yes [provider]  amoxicillin (AMOXIL) 875 MG tablet Take 1 tablet (875 mg total) by mouth 2 (two) times daily. Patient not taking: Reported on 08/13/2019 07/15/19   Mar Daring, PA-C  pantoprazole (PROTONIX) 40 MG tablet Take 1 tablet (40 mg  total) by mouth daily. 09/21/18 09/21/19  Mar Daring, PA-C  predniSONE (DELTASONE) 20 MG tablet Take 1 tablet (20 mg total) by mouth daily with breakfast. Patient not taking: Reported on 08/13/2019 07/15/19   Mar Daring, PA-C     Family History  Problem Relation Age of Onset   Allergies Mother    Diabetes Mother        History of DM   Hyperlipidemia Mother    Hypertension Mother        But is well controlled due to diet/exercise   Colon polyps Mother    Hypertension Father    Diabetes Father        Type 2   Hernia Father    Heart murmur Father    Colon cancer Cousin 5   Breast cancer Neg Hx     Social History   Socioeconomic History   Marital status: Single    Spouse name: Not on file   Number of children: Not on file   Years of education: Not on file   Highest education level: Not on file  Occupational History   Not on file  Social Needs   Financial resource strain: Not on file   Food insecurity    Worry: Not on file    Inability: Not on file   Transportation needs    Medical: Not on file    Non-medical: Not on file  Tobacco Use   Smoking status: Never Smoker   Smokeless tobacco: Never  Used  Substance and Sexual Activity   Alcohol use: No    Comment: occassional   Drug use: No   Sexual activity: Yes    Birth control/protection: None, Surgical    Comment: Tubal ligation; monogomous relationship with female  Lifestyle   Physical activity    Days per week: Not on file    Minutes per session: Not on file   Stress: Not on file  Relationships   Social connections    Talks on phone: Not on file    Gets together: Not on file    Attends religious service: Not on file    Active member of club or organization: Not on file    Attends meetings of clubs or organizations: Not on file    Relationship status: Not on file  Other Topics Concern   Not on file  Social History Narrative   Not on file    ECOG Status: 0 -  Asymptomatic  Review of Systems: A 12 point ROS discussed and pertinent positives are indicated in the HPI above.  All other systems are negative.  Review of Systems  All other systems reviewed and are negative.   Vital Signs: BP (!) 172/111    Pulse 74    Temp 98.8 F (37.1 C) (Oral)    Resp 12    Ht 5\' 1"  (1.549 m)    Wt 114.3 kg    LMP 08/11/2019 Comment: post tubal ligation, pre menopausal   SpO2 100%    BMI 47.61 kg/m   Physical Exam Vitals signs and nursing note reviewed.  Constitutional:      Appearance: Normal appearance.  HENT:     Head: Normocephalic and atraumatic.  Cardiovascular:     Rate and Rhythm: Normal rate and regular rhythm.  Pulmonary:     Effort: Pulmonary effort is normal.     Breath sounds: Normal breath sounds.  Neurological:     Mental Status: She is alert.  Psychiatric:        Mood and Affect: Mood normal.        Behavior: Behavior normal.        Thought Content: Thought content normal.        Judgment: Judgment normal.     Imaging: Ct Soft Tissue Neck W Contrast  Result Date: 08/02/2019 CLINICAL DATA:  Left submandibular mass EXAM: CT NECK WITH CONTRAST TECHNIQUE: Multidetector CT imaging of the neck was performed using the standard protocol following the bolus administration of intravenous contrast. CONTRAST:  34mL OMNIPAQUE IOHEXOL 300 MG/ML  SOLN COMPARISON:  None. FINDINGS: Pharynx and larynx: Normal. No mass or swelling. Salivary glands: No inflammation, mass, or stone. Thyroid: Negative Lymph nodes: Right submental lymph node measures 17 mm with homogeneous enhancement. Right supraclavicular lymph node 11 mm with solid enhancement. No other enlarged lymph nodes in the neck. Vascular: Normal vascular enhancement. Limited intracranial: Negative Visualized orbits: Negative Mastoids and visualized paranasal sinuses: Mucosal edema paranasal sinuses. Mastoid clear bilaterally. Skeleton: Multilevel degenerative change cervical spine. No acute skeletal  abnormality. Impacted right lower molar with surrounding lucency compatible with dentigerous cyst. Upper chest: Negative Other: None IMPRESSION: 17 mm right submental node. 11 mm right supraclavicular node. These nodes are suspicious for neoplasm and biopsy should be considered. Pharyngeal mucosal evaluation recommended. No pharyngeal mass identified. Electronically Signed   By: Franchot Gallo M.D.   On: 08/02/2019 16:47   US Soft Tissue Head/neck  Result Date: 07/22/2019 CLINICAL DATA:  48 year old female with history of swelling of  the submandibular gland EXAM: ULTRASOUND OF HEAD/NECK SOFT TISSUES TECHNIQUE: Ultrasound examination of the head and neck soft tissues was performed in the area of clinical concern. COMPARISON:  None. FINDINGS: Grayscale and color duplex performed in the region of clinical concern. In the midline neck, there is a hypoechoic well-defined soft tissue lesion or cyst measuring 1.8 cm x 1.3 cm x 1.5 cm. This appears to be just superficial 2 strap musculature. Minimal internal color flow. IMPRESSION: Soft tissue lesion in the midline neck in the region clinical concern measures 1.8 cm. Differential includes both benign and malignant entities and further evaluation with contrast-enhanced neck CT is recommended. Electronically Signed   By: Corrie Mckusick D.O.   On: 07/22/2019 15:15    Labs:  CBC: Recent Labs    09/21/18 1449 10/14/18 0909 08/13/19 1015  WBC 4.2 4.3 4.6  HGB 9.9* 10.9* 12.2  HCT 32.6* 35.8 38.0  PLT 356 325 281    COAGS: Recent Labs    08/13/19 1015  INR 1.0    BMP: Recent Labs    09/21/18 1449 08/13/19 1015  NA 139 140  K 4.1 3.7  CL 102 106  CO2 21 26  GLUCOSE 88 112*  BUN 10 11  CALCIUM 9.4 9.4  CREATININE 0.80 0.72  GFRNONAA 89 >60  GFRAA 102 >60    LIVER FUNCTION TESTS: Recent Labs    09/21/18 1449  BILITOT 0.5  AST 14  ALT 14  ALKPHOS 60  PROT 7.0  ALBUMIN 4.1    TUMOR MARKERS: No results for input(s): AFPTM, CEA,  CA199, CHROMGRNA in the last 8760 hours.  Assessment and Plan:  Sarah Monroe is a 48 y.o. female with past medical history significant for hyperlipidemia, GERD and obesity who presents today for ultrasound-guided biopsy of dominant palpable right-sided sub-mental lymph node.  The patient is unaccompanied and serves his own historian.  Patient remains in her baseline state of well health.  Specifically, no fever or chills.  No change in appetite or energy level.  No chest pain or shortness of breath.  Risks and benefits of ultrasound-guided submental lymph node biopsy was discussed with the patient and/or patient's family including, but not limited to bleeding, infection, damage to adjacent structures or low yield requiring additional tests.  All of the questions were answered and there is agreement to proceed.  Consent signed and in chart.  Thank you for this interesting consult.  I greatly enjoyed meeting JESI GOLDHAMMER and look forward to participating in their care.  A copy of this report was sent to the requesting provider on this date.  Electronically Signed: Sandi Mariscal, MD 08/13/2019, 11:52 AM   I spent a total of 15 Minutes in face to face in clinical consultation, greater than 50% of which was counseling/coordinating care for ultrasound-guided submental lymph node biopsy.

## 2019-08-13 NOTE — Procedures (Signed)
Pre Procedure Dx: Submental lymphadenopathy Post Procedural Dx: Same  Technically successful US guided biopsy of dominant right-sided submental lymph node.  EBL: None No immediate complications.   Ronny Bacon, MD Pager #: [phone]

## 2019-08-13 NOTE — Discharge Instructions (Signed)
Needle Biopsy, Care After °This sheet gives you information about how to care for yourself after your procedure. Your health care provider may also give you more specific instructions. If you have problems or questions, contact your health care provider. °What can I expect after the procedure? °After the procedure, it is common to have soreness, bruising, or mild pain at the puncture site. This should go away in a few days. °Follow these instructions at home: °Needle insertion site care ° °· Wash your hands with soap and water before you change your bandage (dressing). If you cannot use soap and water, use hand sanitizer. °· Follow instructions from your health care provider about how to take care of your puncture site. This includes: °? When and how to change your dressing. °? When to remove your dressing. °· Check your puncture site every day for signs of infection. Check for: °? Redness, swelling, or pain. °? Fluid or blood. °? Pus or a bad smell. °? Warmth. °General instructions °· Return to your normal activities as told by your health care provider. Ask your health care provider what activities are safe for you. °· Do not take baths, swim, or use a hot tub until your health care provider approves. Ask your health care provider if you may take showers. You may only be allowed to take sponge baths. °· Take over-the-counter and prescription medicines only as told by your health care provider. °· Keep all follow-up visits as told by your health care provider. This is important. °Contact a health care provider if: °· You have a fever. °· You have redness, swelling, or pain at the puncture site that lasts longer than a few days. °· You have fluid, blood, or pus coming from your puncture site. °· Your puncture site feels warm to the touch. °Get help right away if: °· You have severe bleeding from the puncture site. °Summary °· After the procedure, it is common to have soreness, bruising, or mild pain at the puncture  site. This should go away in a few days. °· Check your puncture site every day for signs of infection, such as redness, swelling, or pain. °· Get help right away if you have severe bleeding from your puncture site. °This information is not intended to replace advice given to you by your health care provider. Make sure you discuss any questions you have with your health care provider. °Document Released: 04/04/2015 Document Revised: 01/30/2018 Document Reviewed: 12/01/2017 °Elsevier Patient Education © 2020 [employer] ° °

## 2019-08-17 LAB — SURGICAL PATHOLOGY

## 2019-08-17 LAB — CYTOLOGY - NON PAP

## 2019-08-18 ENCOUNTER — Encounter: Payer: Self-pay | Admitting: Otolaryngology

## 2020-02-07 ENCOUNTER — Ambulatory Visit: Payer: 59

## 2020-02-18 NOTE — Progress Notes (Signed)
Patient NOS appt for CPE.

## 2020-02-21 ENCOUNTER — Ambulatory Visit (INDEPENDENT_AMBULATORY_CARE_PROVIDER_SITE_OTHER): Payer: 59 | Admitting: Physician Assistant

## 2020-02-21 DIAGNOSIS — Z5329 Procedure and treatment not carried out because of patient's decision for other reasons: Secondary | ICD-10-CM

## 2020-07-24 ENCOUNTER — Other Ambulatory Visit: Payer: Self-pay

## 2020-07-24 ENCOUNTER — Encounter: Payer: Self-pay | Admitting: Physician Assistant

## 2020-07-24 ENCOUNTER — Ambulatory Visit (INDEPENDENT_AMBULATORY_CARE_PROVIDER_SITE_OTHER): Payer: 59 | Admitting: Physician Assistant

## 2020-07-24 VITALS — BP 144/80 | HR 64 | Temp 98.2°F | Ht 61.0 in | Wt 256.0 lb

## 2020-07-24 DIAGNOSIS — N951 Menopausal and female climacteric states: Secondary | ICD-10-CM | POA: Diagnosis not present

## 2020-07-24 DIAGNOSIS — M545 Low back pain, unspecified: Secondary | ICD-10-CM

## 2020-07-24 DIAGNOSIS — Z1159 Encounter for screening for other viral diseases: Secondary | ICD-10-CM

## 2020-07-24 DIAGNOSIS — Z0000 Encounter for general adult medical examination without abnormal findings: Secondary | ICD-10-CM | POA: Diagnosis not present

## 2020-07-24 DIAGNOSIS — E559 Vitamin D deficiency, unspecified: Secondary | ICD-10-CM

## 2020-07-24 DIAGNOSIS — Z6841 Body Mass Index (BMI) 40.0 and over, adult: Secondary | ICD-10-CM

## 2020-07-24 DIAGNOSIS — R7303 Prediabetes: Secondary | ICD-10-CM | POA: Diagnosis not present

## 2020-07-24 DIAGNOSIS — G5601 Carpal tunnel syndrome, right upper limb: Secondary | ICD-10-CM | POA: Diagnosis not present

## 2020-07-24 DIAGNOSIS — E7800 Pure hypercholesterolemia, unspecified: Secondary | ICD-10-CM

## 2020-07-24 DIAGNOSIS — K219 Gastro-esophageal reflux disease without esophagitis: Secondary | ICD-10-CM

## 2020-07-24 MED ORDER — PANTOPRAZOLE SODIUM 40 MG PO TBEC: 40.0000 mg | DELAYED_RELEASE_TABLET | Freq: Every day | ORAL | 1 refills | Status: DC

## 2020-07-24 NOTE — Patient Instructions (Addendum)
Black cohosh OTC for menopause  Cock-up wrist splint for carpal tunnel in right hand  Tumeric 500-1000mg  daily for arthritic pain; tylenol arthritis is ok as well Epsom salt soaks for inflammation  Health Maintenance for Postmenopausal Women Menopause is a normal process in which your ability to get pregnant comes to an end. This process happens slowly over many months or years, usually between the ages of 80 and 12. Menopause is complete when you have missed your menstrual periods for 12 months. It is important to talk with your health care provider about some of the most common conditions that affect women after menopause (postmenopausal women). These include heart disease, cancer, and bone loss (osteoporosis). Adopting a healthy lifestyle and getting preventive care can help to promote your health and wellness. The actions you take can also lower your chances of developing some of these common conditions. What should I know about menopause? During menopause, you may get a number of symptoms, such as:  Hot flashes. These can be moderate or severe.  Night sweats.  Decrease in sex drive.  Mood swings.  Headaches.  Tiredness.  Irritability.  Memory problems.  Insomnia. Choosing to treat or not to treat these symptoms is a decision that you make with your health care provider. Do I need hormone replacement therapy?  Hormone replacement therapy is effective in treating symptoms that are caused by menopause, such as hot flashes and night sweats.  Hormone replacement carries certain risks, especially as you become older. If you are thinking about using estrogen or estrogen with progestin, discuss the benefits and risks with your health care provider. What is my risk for heart disease and stroke? The risk of heart disease, heart attack, and stroke increases as you age. One of the causes may be a change in the body's hormones during menopause. This can affect how your body uses dietary  fats, triglycerides, and cholesterol. Heart attack and stroke are medical emergencies. There are many things that you can do to help prevent heart disease and stroke. Watch your blood pressure  High blood pressure causes heart disease and increases the risk of stroke. This is more likely to develop in people who have high blood pressure readings, are of African descent, or are overweight.  Have your blood pressure checked: ? Every 3-5 years if you are 54-58 years of age. ? Every year if you are 40 years old or older. Eat a healthy diet   Eat a diet that includes plenty of vegetables, fruits, low-fat dairy products, and lean protein.  Do not eat a lot of foods that are high in solid fats, added sugars, or sodium. Get regular exercise Get regular exercise. This is one of the most important things you can do for your health. Most adults should:  Try to exercise for at least 150 minutes each week. The exercise should increase your heart rate and make you sweat (moderate-intensity exercise).  Try to do strengthening exercises at least twice each week. Do these in addition to the moderate-intensity exercise.  Spend less time sitting. Even light physical activity can be beneficial. Other tips  Work with your health care provider to achieve or maintain a healthy weight.  Do not use any products that contain nicotine or tobacco, such as cigarettes, e-cigarettes, and chewing tobacco. If you need help quitting, ask your health care provider.  Know your numbers. Ask your health care provider to check your cholesterol and your blood sugar (glucose). Continue to have your blood tested as  directed by your health care provider. Do I need screening for cancer? Depending on your health history and family history, you may need to have cancer screening at different stages of your life. This may include screening for:  Breast cancer.  Cervical cancer.  Lung cancer.  Colorectal cancer. What is my  risk for osteoporosis? After menopause, you may be at increased risk for osteoporosis. Osteoporosis is a condition in which bone destruction happens more quickly than new bone creation. To help prevent osteoporosis or the bone fractures that can happen because of osteoporosis, you may take the following actions:  If you are 28-38 years old, get at least 1,000 mg of calcium and at least 600 mg of vitamin D per day.  If you are older than age 78 but younger than age 57, get at least 1,200 mg of calcium and at least 600 mg of vitamin D per day.  If you are older than age 13, get at least 1,200 mg of calcium and at least 800 mg of vitamin D per day. Smoking and drinking excessive alcohol increase the risk of osteoporosis. Eat foods that are rich in calcium and vitamin D, and do weight-bearing exercises several times each week as directed by your health care provider. How does menopause affect my mental health? Depression may occur at any age, but it is more common as you become older. Common symptoms of depression include:  Low or sad mood.  Changes in sleep patterns.  Changes in appetite or eating patterns.  Feeling an overall lack of motivation or enjoyment of activities that you previously enjoyed.  Frequent crying spells. Talk with your health care provider if you think that you are experiencing depression. General instructions See your health care provider for regular wellness exams and vaccines. This may include:  Scheduling regular health, dental, and eye exams.  Getting and maintaining your vaccines. These include: ? Influenza vaccine. Get this vaccine each year before the flu season begins. ? Pneumonia vaccine. ? Shingles vaccine. ? Tetanus, diphtheria, and pertussis (Tdap) booster vaccine. Your health care provider may also recommend other immunizations. Tell your health care provider if you have ever been abused or do not feel safe at home. Summary  Menopause is a normal  process in which your ability to get pregnant comes to an end.  This condition causes hot flashes, night sweats, decreased interest in sex, mood swings, headaches, or lack of sleep.  Treatment for this condition may include hormone replacement therapy.  Take actions to keep yourself healthy, including exercising regularly, eating a healthy diet, watching your weight, and checking your blood pressure and blood sugar levels.  Get screened for cancer and depression. Make sure that you are up to date with all your vaccines. This information is not intended to replace advice given to you by your health care provider. Make sure you discuss any questions you have with your health care provider. Document Revised: 11/11/2018 Document Reviewed: 11/11/2018 Elsevier Patient Education  2020 [employer].

## 2020-07-24 NOTE — Progress Notes (Signed)
Complete physical exam   Patient: Sarah Monroe   DOB: [birth year]   49 y.o. Female  MRN: [id number] Visit Date: 07/24/2020  Today's healthcare provider: Mar Daring, PA-C   Chief Complaint  Patient presents with  . Annual Exam   Subjective    Sarah Monroe is a 49 y.o. female who presents today for a complete physical exam.  She reports consuming a general diet. Exercises some. She generally feels fairly well. She reports sleeping fairly well.  Pt has trouble with night sweats.  She does have additional problems to discuss today.  HPI  Wants to discuss menopause and symptoms associated. Has not had a menstrual cycle since March 2021. Having hot flashes and some mild mood swings.   Past Medical History:  Diagnosis Date  . Anemia   . Anxiety   . GERD (gastroesophageal reflux disease)   . Hyperlipidemia   . Pre-diabetes    Past Surgical History:  Procedure Laterality Date  . CESAREAN SECTION     x3  . COLONOSCOPY WITH PROPOFOL N/A 10/18/2016   Procedure: COLONOSCOPY WITH PROPOFOL;  Surgeon: Jonathon Bellows, MD;  Location: [facility] ENDOSCOPY;  Service: Endoscopy;  Laterality: N/A;  . Endometriotic cyst     Dr. Jamal Collin  . GALLBLADDER SURGERY  1999   Dr. Kirtland Bouchard  . Post cholcystectomy  1998  . TUBAL LIGATION Bilateral    Social History   Socioeconomic History  . Marital status: Single    Spouse name: Not on file  . Number of children: Not on file  . Years of education: Not on file  . Highest education level: Not on file  Occupational History  . Not on file  Tobacco Use  . Smoking status: Never Smoker  . Smokeless tobacco: Never Used  Substance and Sexual Activity  . Alcohol use: No    Comment: occassional  . Drug use: No  . Sexual activity: Yes    Birth control/protection: None, Surgical    Comment: Tubal ligation; monogomous relationship with female  Other Topics Concern  . Not on file  Social History Narrative  . Not on file   Social Determinants  of Health   Financial Resource Strain:   . Difficulty of Paying Living Expenses: Not on file  Food Insecurity:   . Worried About Charity fundraiser in the Last Year: Not on file  . Ran Out of Food in the Last Year: Not on file  Transportation Needs:   . Lack of Transportation (Medical): Not on file  . Lack of Transportation (Non-Medical): Not on file  Physical Activity:   . Days of Exercise per Week: Not on file  . Minutes of Exercise per Session: Not on file  Stress:   . Feeling of Stress : Not on file  Social Connections:   . Frequency of Communication with Friends and Family: Not on file  . Frequency of Social Gatherings with Friends and Family: Not on file  . Attends Religious Services: Not on file  . Active Member of Clubs or Organizations: Not on file  . Attends Archivist Meetings: Not on file  . Marital Status: Not on file  Intimate Partner Violence:   . Fear of Current or Ex-Partner: Not on file  . Emotionally Abused: Not on file  . Physically Abused: Not on file  . Sexually Abused: Not on file   Family Status  Relation Name Status  . Mother  Alive  . Father  Deceased  .  MGM  Deceased at age 56       Pancreatic cancer  . MGF  Deceased at age 68       COPD  . PGM  Deceased at age 19       Coronary artery disease;Myocardial Infarction  . PGF  Deceased       died of unknown causes; died before patient was born  . Cousin  Alive  . Sister  Alive  . Brother  Alive  . Brother  Alive  . Brother  Alive  . Brother  Alive  . Sister  Alive  . Neg Hx  (Not Specified)   Family History  Problem Relation Age of Onset  . Allergies Mother   . Diabetes Mother        History of DM  . Hyperlipidemia Mother   . Hypertension Mother        But is well controlled due to diet/exercise  . Colon polyps Mother   . Hypertension Father   . Diabetes Father        Type 2  . Hernia Father   . Heart murmur Father   . Dementia Father   . Colon cancer Cousin 49  .  Hypertension Brother   . Breast cancer Neg Hx    No Known Allergies  Patient Care Team: Rubye Beach as PCP - General (Physician Assistant)   Medications: Outpatient Medications Prior to Visit  Medication Sig  . Cetirizine HCl (ZYRTEC ALLERGY) 10 MG CAPS Take 10 mg by mouth daily as needed.  . Cholecalciferol (VITAMIN D-3) 25 MCG (1000 UT) CAPS Take by mouth.  . ferrous sulfate 325 (65 FE) MG EC tablet Take 325 mg by mouth 3 (three) times daily with meals.  Marland Kitchen amoxicillin (AMOXIL) 875 MG tablet Take 1 tablet (875 mg total) by mouth 2 (two) times daily. (Patient not taking: Reported on 08/13/2019)  . pantoprazole (PROTONIX) 40 MG tablet Take 1 tablet (40 mg total) by mouth daily.  . predniSONE (DELTASONE) 20 MG tablet Take 1 tablet (20 mg total) by mouth daily with breakfast. (Patient not taking: Reported on 08/13/2019)   No facility-administered medications prior to visit.    Review of Systems  Constitutional: Positive for diaphoresis.  HENT: Negative.   Eyes: Positive for photophobia. Negative for pain, discharge, redness, itching and visual disturbance.  Respiratory: Negative.   Cardiovascular: Positive for leg swelling. Negative for chest pain and palpitations.  Gastrointestinal: Positive for abdominal distention and constipation. Negative for abdominal pain, anal bleeding, blood in stool, diarrhea, nausea, rectal pain and vomiting.  Endocrine: Negative.   Genitourinary: Negative.   Musculoskeletal: Negative.   Skin: Negative.   Allergic/Immunologic: Positive for environmental allergies. Negative for food allergies and immunocompromised state.  Neurological: Positive for numbness. Negative for dizziness, tremors, seizures, syncope, facial asymmetry, speech difficulty, weakness, light-headedness and headaches.  Hematological: Negative for adenopathy. Bruises/bleeds easily.  Psychiatric/Behavioral: Negative.       Objective    BP (!) 144/80 (BP Location: Left Arm,  Patient Position: Sitting, Cuff Size: Large)   Pulse 64   Temp 98.2 F (36.8 C) (Oral)   Ht 5\' 1"  (1.549 m)   Wt 256 lb (116.1 kg)   SpO2 100%   BMI 48.37 kg/m    Physical Exam Vitals reviewed.  Constitutional:      General: She is not in acute distress.    Appearance: Normal appearance. She is well-developed. She is obese. She is not ill-appearing or diaphoretic.  HENT:  Head: Normocephalic and atraumatic.     Right Ear: Tympanic membrane, ear canal and external ear normal.     Left Ear: Tympanic membrane, ear canal and external ear normal.     Nose: Nose normal.     Mouth/Throat:     Mouth: Mucous membranes are moist.     Pharynx: Oropharynx is clear. No oropharyngeal exudate.  Eyes:     General: No scleral icterus.       Right eye: No discharge.        Left eye: No discharge.     Extraocular Movements: Extraocular movements intact.     Conjunctiva/sclera: Conjunctivae normal.     Pupils: Pupils are equal, round, and reactive to light.  Neck:     Thyroid: No thyromegaly.     Vascular: No carotid bruit or JVD.     Trachea: No tracheal deviation.  Cardiovascular:     Rate and Rhythm: Normal rate and regular rhythm.     Pulses: Normal pulses.     Heart sounds: Normal heart sounds. No murmur heard.  No friction rub. No gallop.   Pulmonary:     Effort: Pulmonary effort is normal. No respiratory distress.     Breath sounds: Normal breath sounds. No wheezing or rales.  Chest:     Chest wall: No tenderness.  Abdominal:     General: Abdomen is flat. Bowel sounds are normal. There is no distension.     Palpations: Abdomen is soft. There is no mass.     Tenderness: There is no abdominal tenderness. There is no guarding or rebound.  Musculoskeletal:        General: No tenderness. Normal range of motion.     Cervical back: Normal range of motion and neck supple.     Right lower leg: No edema.     Left lower leg: No edema.  Lymphadenopathy:     Cervical: No cervical  adenopathy.  Skin:    General: Skin is warm and dry.     Capillary Refill: Capillary refill takes less than 2 seconds.     Findings: No rash.  Neurological:     General: No focal deficit present.     Mental Status: She is alert and oriented to person, place, and time. Mental status is at baseline.  Psychiatric:        Mood and Affect: Mood normal.        Behavior: Behavior normal.        Thought Content: Thought content normal.        Judgment: Judgment normal.     Last depression screening scores PHQ 2/9 Scores 07/24/2020 09/21/2018 09/24/2016  PHQ - 2 Score 0 0 0  PHQ- 9 Score 0 - -   Last fall risk screening Fall Risk  09/21/2018  Falls in the past year? No   Last Audit-C alcohol use screening Alcohol Use Disorder Test (AUDIT) 07/24/2020  1. How often do you have a drink containing alcohol? 1  2. How many drinks containing alcohol do you have on a typical day when you are drinking? 0  3. How often do you have six or more drinks on one occasion? 0  AUDIT-C Score 1  Alcohol Brief Interventions/Follow-up AUDIT Score <7 follow-up not indicated   A score of 3 or more in women, and 4 or more in men indicates increased risk for alcohol abuse, EXCEPT if all of the points are from question 1   No results found for any visits  on 07/24/20.  Assessment & Plan    Routine Health Maintenance and Physical Exam  Exercise Activities and Dietary recommendations Goals    . Exercise 150 minutes per week (moderate activity)       Immunization History  Administered Date(s) Administered  . Influenza,inj,Quad PF,6+ Mos 12/31/2013, 08/23/2015  . PFIZER SARS-COV-2 Vaccination 05/13/2020, 06/03/2020  . Td 01/16/2005  . Tdap 12/31/2013    Health Maintenance  Topic Date Due  . Hepatitis C Screening  Never done  . INFLUENZA VACCINE  07/02/2020  . PAP SMEAR-Modifier  10/15/2021  . COLONOSCOPY  10/18/2021  . TETANUS/TDAP  01/01/2024  . COVID-19 Vaccine  Completed  . HIV Screening   Addressed    Discussed health benefits of physical activity, and encouraged her to engage in regular exercise appropriate for her age and condition.   No follow-ups on file.     1. Annual physical exam Normal physical exam today. Will check labs as below and f/u pending lab results. If labs are stable and WNL she will not need to have these rechecked for one year at her next annual physical exam. She is to call the office in the meantime if she has any acute issue, questions or concerns.  2. Gastroesophageal reflux disease without esophagitis Stable. Diagnosis pulled for medication refill. Continue current medical treatment plan. Will check labs as below and f/u pending results. - pantoprazole (PROTONIX) 40 MG tablet; Take 1 tablet (40 mg total) by mouth daily.  Dispense: 90 tablet; Refill: 1 - CBC w/Diff/Platelet - Comprehensive Metabolic Panel (CMET) - TSH - Lipid Panel With LDL/HDL Ratio - HgB A1c  3. Borderline diabetes Diet controlled. Will check labs as below and f/u pending results. - CBC w/Diff/Platelet - Comprehensive Metabolic Panel (CMET) - TSH - Lipid Panel With LDL/HDL Ratio - HgB A1c  4. Hypercholesteremia Diet controlled. Will check labs as below and f/u pending results. - CBC w/Diff/Platelet - Comprehensive Metabolic Panel (CMET) - TSH - Lipid Panel With LDL/HDL Ratio - HgB A1c  5. Avitaminosis D H/O this and postmenopausal. Taking OTC Vit D 1000 IU daily. Will check labs as below and f/u pending results. - CBC w/Diff/Platelet - Comprehensive Metabolic Panel (CMET) - TSH - Lipid Panel With LDL/HDL Ratio - HgB A1c  6. Class 3 severe obesity due to excess calories with serious comorbidity and body mass index (BMI) of 45.0 to 49.9 in adult [facility]) Counseled patient on healthy lifestyle modifications including dieting and exercise.  Will check labs as below and f/u pending results. - CBC w/Diff/Platelet - Comprehensive Metabolic Panel (CMET) - TSH - Lipid  Panel With LDL/HDL Ratio - HgB A1c  7. Encounter for hepatitis C screening test for low risk patient Will check labs as below and f/u pending results. - Hepatitis C Antibody  8. Carpal tunnel syndrome of right wrist Patient is having more numbness in the right hand. Does type a lot for work. Advised she can use ibuprofen or aleve prn. Wearing a cock-up wrist splint will help. Can also wear overnight if waking up with hand numbness. Call if worsening and will refer to Dr. Peggye Ley, hand surgeon.   9. Menopausal symptoms Hot flashes, some mood swings, fatigue with weight gain, and some brain fog symptoms. Discussed trying black cohosh OTC. Call if worsening and can discuss further.   10. Acute midline low back pain without sciatica Can use moist heat, epsom salt soaks. Stretches. Can use tylenol arthritis as this has been helping. Discussed also using Tumeric for  inflammation instead as well. Call if worsening.     Rubye Beach  [facility] [phone] (phone) [phone] (fax)  [place]

## 2020-07-29 LAB — COMPREHENSIVE METABOLIC PANEL
ALT: 20 IU/L (ref 0–32)
AST: 18 IU/L (ref 0–40)
Albumin/Globulin Ratio: 1.5 (ref 1.2–2.2)
Albumin: 4.4 g/dL (ref 3.8–4.8)
Alkaline Phosphatase: 69 IU/L (ref 48–121)
BUN/Creatinine Ratio: 12 (ref 9–23)
BUN: 9 mg/dL (ref 6–24)
Bilirubin Total: 0.6 mg/dL (ref 0.0–1.2)
CO2: 23 mmol/L (ref 20–29)
Calcium: 10.1 mg/dL (ref 8.7–10.2)
Chloride: 102 mmol/L (ref 96–106)
Creatinine, Ser: 0.76 mg/dL (ref 0.57–1.00)
GFR calc Af Amer: 107 mL/min/{1.73_m2} (ref >59)
GFR calc non Af Amer: 93 mL/min/{1.73_m2} (ref >59)
Globulin, Total: 2.9 g/dL (ref 1.5–4.5)
Glucose: 104 mg/dL — ABNORMAL HIGH (ref 65–99)
Potassium: 4.2 mmol/L (ref 3.5–5.2)
Sodium: 140 mmol/L (ref 134–144)
Total Protein: 7.3 g/dL (ref 6.0–8.5)

## 2020-07-29 LAB — CBC WITH DIFFERENTIAL/PLATELET
Basophils Absolute: 0 10*3/uL (ref 0.0–0.2)
Basos: 1 %
EOS (ABSOLUTE): 0.1 10*3/uL (ref 0.0–0.4)
Eos: 3 %
Hematocrit: 42 % (ref 34.0–46.6)
Hemoglobin: 13.8 g/dL (ref 11.1–15.9)
Immature Grans (Abs): 0 10*3/uL (ref 0.0–0.1)
Immature Granulocytes: 0 %
Lymphocytes Absolute: 1.2 10*3/uL (ref 0.7–3.1)
Lymphs: 30 %
MCH: 27.2 pg (ref 26.6–33.0)
MCHC: 32.9 g/dL (ref 31.5–35.7)
MCV: 83 fL (ref 79–97)
Monocytes Absolute: 0.3 10*3/uL (ref 0.1–0.9)
Monocytes: 8 %
Neutrophils Absolute: 2.3 10*3/uL (ref 1.4–7.0)
Neutrophils: 58 %
Platelets: 278 10*3/uL (ref 150–450)
RBC: 5.08 x10E6/uL (ref 3.77–5.28)
RDW: 14.1 % (ref 11.7–15.4)
WBC: 4 10*3/uL (ref 3.4–10.8)

## 2020-07-29 LAB — LIPID PANEL WITH LDL/HDL RATIO
Cholesterol, Total: 212 mg/dL — ABNORMAL HIGH (ref 100–199)
HDL: 61 mg/dL (ref >39)
LDL Chol Calc (NIH): 133 mg/dL — ABNORMAL HIGH (ref 0–99)
LDL/HDL Ratio: 2.2 ratio (ref 0.0–3.2)
Triglycerides: 102 mg/dL (ref 0–149)
VLDL Cholesterol Cal: 18 mg/dL (ref 5–40)

## 2020-07-29 LAB — HEMOGLOBIN A1C
Est. average glucose Bld gHb Est-mCnc: 126 mg/dL
Hgb A1c MFr Bld: 6 % — ABNORMAL HIGH (ref 4.8–5.6)

## 2020-07-29 LAB — HEPATITIS C ANTIBODY: Hep C Virus Ab: <0.1 s/co ratio (ref 0.0–0.9)

## 2020-07-29 LAB — TSH: TSH: 0.717 u[IU]/mL (ref 0.450–4.500)

## 2020-09-13 ENCOUNTER — Other Ambulatory Visit: Payer: Self-pay | Admitting: Physician Assistant

## 2020-09-13 DIAGNOSIS — Z1231 Encounter for screening mammogram for malignant neoplasm of breast: Secondary | ICD-10-CM

## 2020-09-14 ENCOUNTER — Encounter: Payer: Self-pay | Admitting: Physician Assistant

## 2020-09-15 NOTE — Telephone Encounter (Signed)
Sarah Monroe,  Can you print this form? I cannot.  [name], [facility]

## 2020-09-20 NOTE — Telephone Encounter (Signed)
Pt called and is requesting an update on this form. Please advise.

## 2020-09-21 NOTE — Telephone Encounter (Signed)
Patient called in to say that she will fax this form over today 09/21/20 and would like to pick it up today please since it is a time sensative form. Please call Ph# [phone] when done

## 2020-09-21 NOTE — Telephone Encounter (Signed)
PT need this form by tomorrow / please advise

## 2020-09-22 NOTE — Telephone Encounter (Signed)
Patient advised that form is ready for pick up.

## 2020-10-16 ENCOUNTER — Other Ambulatory Visit: Payer: Self-pay

## 2020-10-16 ENCOUNTER — Ambulatory Visit
Admission: RE | Admit: 2020-10-16 | Discharge: 2020-10-16 | Disposition: A | Discharge status: Home / Self Care | Payer: 59 | Source: Ambulatory Visit | Attending: Physician Assistant | Admitting: Physician Assistant

## 2020-10-16 DIAGNOSIS — Z1231 Encounter for screening mammogram for malignant neoplasm of breast: Secondary | ICD-10-CM | POA: Insufficient documentation

## 2020-10-18 ENCOUNTER — Telehealth: Payer: Self-pay

## 2020-10-18 NOTE — Telephone Encounter (Signed)
Copied from [place] [phone]. Topic: General - Call Back - No Documentation >> Oct 18, 2020  3:19 PM Erick Blinks wrote: Reason for CRM: Pt returned call to the office for mammogram results, please advise  Best contact: [phone]

## 2020-10-18 NOTE — Telephone Encounter (Signed)
Please see results notes

## 2021-07-20 ENCOUNTER — Ambulatory Visit: Payer: Self-pay | Admitting: *Deleted

## 2021-07-20 ENCOUNTER — Telehealth: Payer: Self-pay

## 2021-07-20 NOTE — Telephone Encounter (Signed)
Copied from [place] [id number]. Topic: General - Other >> Jul 20, 2021 12:26 PM Tessa Lerner A wrote: Reason for CRM: Patient has called back to provide additional BP readings, as directed by staff  139/88 at 12:25 PM 143/84 at 12:26 PM (patient was experiencing shoulder pain) 137/87 at 12:27 PM  Please contact further as needed

## 2021-07-20 NOTE — Telephone Encounter (Signed)
Reason for Disposition  [id number] Systolic BP  >= [id number] OR Diastolic >= 80 AND [id number] pregnant  Answer Assessment - Initial Assessment Questions 1. BLOOD PRESSURE: "What is the blood pressure?" "Did you take at least two measurements 5 minutes apart?"     Patient not at home and unable to recheck B/P at this time 2. ONSET: "When did you take your blood pressure?"     Now today at "wellness" exam at [employer] 162/96 3. 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 HISTORY: "Do you have a history of high blood pressure?"     no 5. MEDICATIONS: "Are you taking any medications for blood pressure?" "Have you missed any doses recently?"     na 6. OTHER SYMPTOMS: "Do you have any symptoms?" (e.g., headache, chest pain, blurred vision, difficulty breathing, weakness)     Headache  7. PREGNANCY: "Is there any chance you are pregnant?" "When was your last menstrual period?"     On menstrual cycle now  Protocols used: Blood Pressure - High-A-AH

## 2021-07-20 NOTE — Telephone Encounter (Signed)
C/o elevated B/P during wellness exam at [employer]. B/P 162/96 . B/P was not rechecked during wellness exam prior to patient leaving. Patient unable to recheck B/P for NT she was not at home. Patient concerned due to baseline B/P 140/80's. C/o headache and reports it is not severe and feels headaches are common for her due to a pinched nerve in her neck. Patient is currently seeing a chiropractor for therapy. Denies chest pain , difficulty breathing, no blurred vision, no weakness on either side , no facial numbness or any other neurological findings. Denies dizziness or lightheadedness no decrease in balance. Instructed patient to increase water intake today and recheck B/P when she gets home and keep a log to review with PCP. Call back if B/P remains elevated. Appt scheduled 07/23/21. Care advise given. Patient verbalized understanding of care advise and to call back or go to [facility] or ED if symptoms worsen or B/P remains elevated.

## 2021-07-20 NOTE — Telephone Encounter (Signed)
Noted  

## 2021-07-23 ENCOUNTER — Other Ambulatory Visit: Payer: Self-pay

## 2021-07-23 ENCOUNTER — Ambulatory Visit: Payer: 59 | Admitting: Family Medicine

## 2021-07-23 ENCOUNTER — Encounter: Payer: Self-pay | Admitting: Family Medicine

## 2021-07-23 VITALS — BP 138/81 | HR 69 | Temp 98.4°F | Resp 16 | Ht 61.0 in | Wt 257.5 lb

## 2021-07-23 DIAGNOSIS — R030 Elevated blood-pressure reading, without diagnosis of hypertension: Secondary | ICD-10-CM | POA: Insufficient documentation

## 2021-07-23 DIAGNOSIS — G629 Polyneuropathy, unspecified: Secondary | ICD-10-CM

## 2021-07-23 MED ORDER — GABAPENTIN 300 MG PO CAPS: 300.0000 mg | ORAL_CAPSULE | Freq: Every day | ORAL | 2 refills | Status: DC

## 2021-07-23 NOTE — Progress Notes (Signed)
Established patient visit   Patient: Sarah Monroe   DOB: 08/31/1971   50 y.o. Female  MRN: [id number] Visit Date: 07/23/2021  Today's healthcare provider: Lavon Paganini, MD   Chief Complaint  Patient presents with   Hypertension    Subjective    Hypertension Pertinent negatives include no chest pain.   Elevated BP - Pt. reports an elevated BP reading at [employer] visit of 162/96 on Fri. - Pt. states that home BP readings range 130s/80s  Peripheral Mononeuropathy - Pt. reports L median nerve pain and paresthesias after blood draw on Fri. - Describes "electric" nerve pain, tingling, & occasional numbness extending from L antecubital fossa to L thumb     Medications: Outpatient Medications Prior to Visit  Medication Sig   Cetirizine HCl 10 MG CAPS Take 10 mg by mouth daily as needed.   Cholecalciferol (VITAMIN D-3) 25 MCG (1000 UT) CAPS Take by mouth.   ferrous sulfate 325 (65 FE) MG EC tablet Take 325 mg by mouth 3 (three) times daily with meals.   pantoprazole (PROTONIX) 40 MG tablet Take 1 tablet (40 mg total) by mouth daily.   No facility-administered medications prior to visit.    Review of Systems  Constitutional:  Negative for activity change, appetite change, fatigue and fever.  Eyes: Negative.   Cardiovascular: Negative.  Negative for chest pain and leg swelling.  Gastrointestinal: Negative.   Endocrine: Negative.   Genitourinary: Negative.   Musculoskeletal: Negative.   Skin: Negative.   Neurological: Negative.       Objective    BP 138/81 (BP Location: Left Arm, Patient Position: Sitting, Cuff Size: Large)   Pulse 69   Temp 98.4 F (36.9 C) (Oral)   Resp 16   Ht '5\' 1"'$  (1.549 m)   Wt 257 lb 8 oz (116.8 kg)   BMI 48.65 kg/m     Physical Exam Constitutional:      General: She is not in acute distress.    Appearance: Normal appearance. She is obese.  HENT:     Head: Normocephalic and atraumatic.     Right Ear: External  ear normal.     Left Ear: External ear normal.  Eyes:     Conjunctiva/sclera: Conjunctivae normal.  Cardiovascular:     Rate and Rhythm: Normal rate and regular rhythm.     Pulses: Normal pulses.     Heart sounds: Normal heart sounds.  Pulmonary:     Effort: Pulmonary effort is normal.     Breath sounds: Normal breath sounds.  Abdominal:     General: Bowel sounds are normal.     Palpations: Abdomen is soft.  Musculoskeletal:        General: No swelling or deformity.     Right lower leg: No edema.     Left lower leg: No edema.  Skin:    General: Skin is warm and dry.  Neurological:     Mental Status: She is alert.     Motor: Weakness present.     Comments: Reproducible pain in L median nerve distribution; weakness and pain with L thumb flexion; intact grip strength bilaterally     No results found for any visits on 07/23/21.  Assessment & Plan     Problem List Items Addressed This Visit       Nervous and Auditory   Neuropathy    - Acute, uncomplicated mononeuropathy in L median nerve distribution  - Start Gabapentin prn for nerve  pain - Reassured pt. that symptoms may self-resolve over next several weeks/months - Will continue to monitor - May consider nerve conduction studies if symptoms worsen or persist > 6 months        Other   Morbidly obese (HCC)    - Chronic, stable - Discussed diet and exercise - Discussed importance of healthy weight management      Elevated BP without diagnosis of hypertension - Primary    - Episodic BP elevation in the absence of hypertensive disorder - Counseled pt. on DASH diet & continuing regular exercise - Instructed pt. to continue monitoring BP at home and to contact office for concerns - Will continue to monitor        Return in about 3 months (around 10/23/2021) for CPE, With new PCP.      Percell Locus, MS3   Patient seen along with MS3 student Percell Locus. I personally evaluated this patient along with the  student, and verified all aspects of the history, physical exam, and medical decision making as documented by the student. I agree with the student's documentation and have made all necessary edits.  Leyland Kenna, Dionne Bucy, MD, MPH [facility]

## 2021-07-23 NOTE — Assessment & Plan Note (Addendum)
-   Acute, uncomplicated mononeuropathy in L median nerve distribution, likely 2/2 mechanical trauma from blood draw - Start Gabapentin prn for nerve pain - Reassured pt. that symptoms may self-resolve over next several weeks/months - Will continue to monitor - May consider nerve conduction studies if symptoms worsen or persist > 6 months

## 2021-07-23 NOTE — Telephone Encounter (Signed)
Thank you. Those look decent. We can discuss at visit this afternoon

## 2021-07-23 NOTE — Assessment & Plan Note (Addendum)
-   Episodic BP elevation in the absence of hypertensive disorder - Counseled pt. on DASH diet & continuing regular exercise - Instructed pt. to continue monitoring BP at home and to contact office for concerns - Will continue to monitor

## 2021-07-23 NOTE — Assessment & Plan Note (Signed)
-   Chronic, stable - Discussed diet and exercise - Discussed importance of healthy weight management

## 2021-07-31 ENCOUNTER — Telehealth: Payer: Self-pay

## 2021-07-31 DIAGNOSIS — G629 Polyneuropathy, unspecified: Secondary | ICD-10-CM

## 2021-07-31 NOTE — Telephone Encounter (Signed)
Copied from [place] [id number]. Topic: General - Inquiry >> Jul 31, 2021  3:48 PM Loma Boston wrote: On 8/22 pt meet with Dr B and pt states that there was a discussion concerning a referral / pt stated a biometric screening and person hit a nerve and was prescribed Gabapentin for nerve pain. Pt now is wanting to know if she can go forward with a specialist that was discussed. Please contact pt at [phone].

## 2021-07-31 NOTE — Telephone Encounter (Signed)
Please review.  I'm not sure if you want to refer her to a specialist up just order the nerve conduction study.   Thanks,   -Mickel Baas

## 2021-09-18 ENCOUNTER — Other Ambulatory Visit: Payer: Self-pay

## 2021-09-18 ENCOUNTER — Ambulatory Visit (INDEPENDENT_AMBULATORY_CARE_PROVIDER_SITE_OTHER): Payer: 59 | Admitting: Family Medicine

## 2021-09-18 ENCOUNTER — Encounter: Payer: Self-pay | Admitting: Family Medicine

## 2021-09-18 VITALS — BP 155/97 | HR 61 | Temp 98.5°F | Resp 16 | Ht 61.0 in | Wt 241.1 lb

## 2021-09-18 DIAGNOSIS — Z1231 Encounter for screening mammogram for malignant neoplasm of breast: Secondary | ICD-10-CM

## 2021-09-18 DIAGNOSIS — E7800 Pure hypercholesterolemia, unspecified: Secondary | ICD-10-CM

## 2021-09-18 DIAGNOSIS — E559 Vitamin D deficiency, unspecified: Secondary | ICD-10-CM

## 2021-09-18 DIAGNOSIS — R030 Elevated blood-pressure reading, without diagnosis of hypertension: Secondary | ICD-10-CM

## 2021-09-18 NOTE — Progress Notes (Signed)
Complete physical exam   Patient: Sarah Monroe   DOB: [birth year]   50 y.o. Female  MRN: [id number] Visit Date: 09/18/2021  Today's healthcare provider: Gwyneth Sprout, FNP   Chief Complaint  Patient presents with   Annual Exam   Subjective     HPI  Sarah Monroe is a 50 y.o. female who presents today for a complete physical exam.  She reports consuming a bariatric diet, patient states that she eats 1-2 meals a day and one snack. Gym/ health club routine includes cardio and weight training. She generally feels well. She reports sleeping well. She does not have additional problems to discuss today. Patient reports that she plans to get her flu vaccine and covid booster in the next week at her local pharmacy.  Past Medical History:  Diagnosis Date   Anemia    Anxiety    GERD (gastroesophageal reflux disease)    Hyperlipidemia    Pre-diabetes    Past Surgical History:  Procedure Laterality Date   CESAREAN SECTION     x3   COLONOSCOPY WITH PROPOFOL N/A 10/18/2016   Procedure: COLONOSCOPY WITH PROPOFOL;  Surgeon: Jonathon Bellows, MD;  Location: [facility] ENDOSCOPY;  Service: Endoscopy;  Laterality: N/A;   Endometriotic cyst     Dr. Jamal Collin   GALLBLADDER SURGERY  1999   Dr. Kirtland Bouchard   Post cholcystectomy  1998   TUBAL LIGATION Bilateral    Social History   Socioeconomic History   Marital status: Single    Spouse name: Not on file   Number of children: Not on file   Years of education: Not on file   Highest education level: Not on file  Occupational History   Not on file  Tobacco Use   Smoking status: Never   Smokeless tobacco: Never  Substance and Sexual Activity   Alcohol use: No    Comment: occassional   Drug use: No   Sexual activity: Yes    Birth control/protection: None, Surgical    Comment: Tubal ligation; monogomous relationship with female  Other Topics Concern   Not on file  Social History Narrative   Not on file   Social Determinants of Health    Financial Resource Strain: Not on file  Food Insecurity: Not on file  Transportation Needs: Not on file  Physical Activity: Not on file  Stress: Not on file  Social Connections: Not on file  Intimate Partner Violence: Not on file   Family Status  Relation Name Status   Mother  Alive   Father  Deceased   MGM  Deceased at age 34       Pancreatic cancer   MGF  Deceased at age 47       COPD   PGM  Deceased at age 20       Coronary artery disease;Myocardial Infarction   PGF  Deceased       died of unknown causes; died before patient was born   Nutritional therapist   Sister  Alive   Brother  Alive   Brother  Alive   Brother  Alive   Brother  Alive   Sister  Alive   Neg Hx  (Not Specified)   Family History  Problem Relation Age of Onset   Allergies Mother    Diabetes Mother        History of DM   Hyperlipidemia Mother    Hypertension Mother        But is well  controlled due to diet/exercise   Colon polyps Mother    Hypertension Father    Diabetes Father        Type 2   Hernia Father    Heart murmur Father    Dementia Father    Colon cancer Cousin 92   Hypertension Brother    Breast cancer Neg Hx    No Known Allergies  Patient Care Team: Gwyneth Sprout, FNP as PCP - General (Family Medicine)   Medications: Outpatient Medications Prior to Visit  Medication Sig   Cetirizine HCl 10 MG CAPS Take 10 mg by mouth daily as needed.   Cholecalciferol (VITAMIN D-3) 25 MCG (1000 UT) CAPS Take by mouth.   ferrous sulfate 325 (65 FE) MG EC tablet Take 325 mg by mouth 3 (three) times daily with meals.   gabapentin (NEURONTIN) 300 MG capsule Take 1 capsule (300 mg total) by mouth at bedtime.   pantoprazole (PROTONIX) 40 MG tablet Take 1 tablet (40 mg total) by mouth daily.   No facility-administered medications prior to visit.    Review of Systems  HENT:  Positive for sinus pressure, sneezing and sore throat.   Eyes:  Positive for redness and itching.  Gastrointestinal:   Positive for constipation.  Musculoskeletal:  Positive for arthralgias.  Skin:  Positive for rash.  Allergic/Immunologic: Positive for environmental allergies.  Hematological:  Bruises/bleeds easily.  All other systems reviewed and are negative. Being seen by derm for skin concerns; Being seen by Neurology later this week for nerve damage on L hand s/p lab draw Allergy symptoms wax/wane based on season- do not need workup    Objective    BP (!) 155/97   Pulse 61   Temp 98.5 F (36.9 C) (Oral)   Resp 16   Ht 5\' 1"  (1.549 m)   Wt 241 lb 1.6 oz (109.4 kg)   BMI 45.56 kg/m    Physical Exam Vitals and nursing note reviewed.  Constitutional:      General: She is awake. She is not in acute distress.    Appearance: Normal appearance. She is well-developed and well-groomed. She is obese. She is not ill-appearing, toxic-appearing or diaphoretic.  HENT:     Head: Normocephalic and atraumatic.     Jaw: There is normal jaw occlusion. No trismus, tenderness, swelling or pain on movement.     Right Ear: Hearing, tympanic membrane, ear canal and external ear normal. There is no impacted cerumen.     Left Ear: Hearing, tympanic membrane, ear canal and external ear normal. There is no impacted cerumen.     Nose: Nose normal. No congestion or rhinorrhea.     Right Turbinates: Not enlarged, swollen or pale.     Left Turbinates: Not enlarged, swollen or pale.     Right Sinus: No maxillary sinus tenderness or frontal sinus tenderness.     Left Sinus: No maxillary sinus tenderness or frontal sinus tenderness.     Mouth/Throat:     Lips: Pink.     Mouth: Mucous membranes are moist. No injury.     Tongue: No lesions.     Pharynx: Oropharynx is clear. Uvula midline. No pharyngeal swelling, oropharyngeal exudate, posterior oropharyngeal erythema or uvula swelling.     Tonsils: No tonsillar exudate or tonsillar abscesses.  Eyes:     General: Lids are normal. Lids are everted, no foreign bodies  appreciated. Vision grossly intact. Gaze aligned appropriately. No allergic shiner or visual field deficit.  Right eye: No discharge.        Left eye: No discharge.     Extraocular Movements: Extraocular movements intact.     Conjunctiva/sclera: Conjunctivae normal.     Right eye: Right conjunctiva is not injected. No exudate.    Left eye: Left conjunctiva is not injected. No exudate.    Pupils: Pupils are equal, round, and reactive to light.  Neck:     Thyroid: No thyroid mass, thyromegaly or thyroid tenderness.     Vascular: No carotid bruit.     Trachea: Trachea normal.  Cardiovascular:     Rate and Rhythm: Normal rate and regular rhythm.     Pulses: Normal pulses.          Carotid pulses are 2+ on the right side and 2+ on the left side.      Radial pulses are 2+ on the right side and 2+ on the left side.       Dorsalis pedis pulses are 2+ on the right side and 2+ on the left side.       Posterior tibial pulses are 2+ on the right side and 2+ on the left side.     Heart sounds: Normal heart sounds, S1 normal and S2 normal. No murmur heard.   No friction rub. No gallop.  Pulmonary:     Effort: Pulmonary effort is normal. No respiratory distress.     Breath sounds: Normal breath sounds and air entry. No stridor. No wheezing, rhonchi or rales.  Chest:     Chest wall: No tenderness.     Comments: Breast exam WDL; discussed 'know your lemons' campaign and self exam Abdominal:     General: Abdomen is flat. Bowel sounds are normal. There is no distension.     Palpations: Abdomen is soft. There is no mass.     Tenderness: There is no abdominal tenderness. There is no right CVA tenderness, left CVA tenderness, guarding or rebound.     Hernia: No hernia is present.  Genitourinary:    Comments: Exam deferred; denies complaints Musculoskeletal:        General: No swelling, tenderness, deformity or signs of injury. Normal range of motion.     Cervical back: Full passive range of motion  without pain, normal range of motion and neck supple. No edema, rigidity or tenderness. No muscular tenderness.     Right lower leg: No edema.     Left lower leg: No edema.  Lymphadenopathy:     Cervical: No cervical adenopathy.     Right cervical: No superficial, deep or posterior cervical adenopathy.    Left cervical: No superficial, deep or posterior cervical adenopathy.  Skin:    General: Skin is warm and dry.     Capillary Refill: Capillary refill takes less than 2 seconds.     Coloration: Skin is not jaundiced or pale.     Findings: No bruising, erythema, lesion or rash.  Neurological:     General: No focal deficit present.     Mental Status: She is alert and oriented to person, place, and time. Mental status is at baseline.     GCS: GCS eye subscore is 4. GCS verbal subscore is 5. GCS motor subscore is 6.     Sensory: Sensation is intact. No sensory deficit.     Motor: Motor function is intact. No weakness.     Coordination: Coordination is intact. Coordination normal.     Gait: Gait is intact. Gait normal.  Psychiatric:  Attention and Perception: Attention and perception normal.        Mood and Affect: Mood and affect normal.        Speech: Speech normal.        Behavior: Behavior normal. Behavior is cooperative.        Thought Content: Thought content normal.        Cognition and Memory: Cognition and memory normal.        Judgment: Judgment normal.     Last depression screening scores PHQ 2/9 Scores 07/23/2021 07/24/2020 09/21/2018  PHQ - 2 Score 0 0 0  PHQ- 9 Score 0 0 -   Last fall risk screening Fall Risk  07/23/2021  Falls in the past year? 0  Number falls in past yr: 0  Injury with Fall? 0  Risk for fall due to : No Fall Risks  Follow up Falls evaluation completed   Last Audit-C alcohol use screening Alcohol Use Disorder Test (AUDIT) 07/23/2021  1. How often do you have a drink containing alcohol? 1  2. How many drinks containing alcohol do you have on a  typical day when you are drinking? 0  3. How often do you have six or more drinks on one occasion? 0  AUDIT-C Score 1  Alcohol Brief Interventions/Follow-up -   A score of 3 or more in women, and 4 or more in men indicates increased risk for alcohol abuse, EXCEPT if all of the points are from question 1   No results found for any visits on 09/18/21.  Assessment & Plan    Routine Health Maintenance and Physical Exam  Exercise Activities and Dietary recommendations  Goals      Exercise 150 minutes per week (moderate activity)        Immunization History  Administered Date(s) Administered   Influenza,inj,Quad PF,6+ Mos 12/31/2013, 08/23/2015   PFIZER(Purple Top)SARS-COV-2 Vaccination 05/13/2020, 06/03/2020   Td 01/16/2005   Tdap 12/31/2013    Health Maintenance  Topic Date Due   COVID-19 Vaccine (3 - Booster for Pfizer series) 11/03/2020   INFLUENZA VACCINE  07/02/2021   PAP SMEAR-Modifier  10/15/2021   COLONOSCOPY (Pts 45-28yrs Insurance coverage will need to be confirmed)  10/18/2021   TETANUS/TDAP  01/01/2024   Hepatitis C Screening  Completed   HIV Screening  Addressed   HPV VACCINES  Aged Out    Discussed health benefits of physical activity, and encouraged her to engage in regular exercise appropriate for her age and condition.  Problem List Items Addressed This Visit       Other   Hypercholesteremia    recommend diet low in saturated fat and regular exercise - 30 min at least 5 times per week       Avitaminosis D    Currently take 1000 IU daily; recommend repeat blood work in 3 months      Morbidly obese ([place])    -hld, pre-diabetes, elevated bp Discussed importance of healthy weight management Discussed diet and exercise       Elevated BP without diagnosis of hypertension    Plan to go off diet supplement Continue to monitor Discussed goal <130/<80      Other Visit Diagnoses     Encounter for screening mammogram for malignant neoplasm of  breast    -  Primary   Relevant Orders   MS DIGITAL SCREENING TOMO BILATERAL        Return in about 3 months (around 12/19/2021) for HTN management, blood pressure check.  Vonna Kotyk, FNP, have reviewed all documentation for this visit. The documentation on 09/18/21 for the exam, diagnosis, procedures, and orders are all accurate and complete.  Gwyneth Sprout, [place] [phone] (phone) [phone] (fax)  [place]

## 2021-09-18 NOTE — Assessment & Plan Note (Signed)
recommend diet low in saturated fat and regular exercise - 30 min at least 5 times per week

## 2021-09-18 NOTE — Assessment & Plan Note (Signed)
Currently take 1000 IU daily; recommend repeat blood work in 3 months

## 2021-09-18 NOTE — Assessment & Plan Note (Signed)
-  hld, pre-diabetes, elevated bp Discussed importance of healthy weight management Discussed diet and exercise

## 2021-09-18 NOTE — Assessment & Plan Note (Signed)
Plan to go off diet supplement Continue to monitor Discussed goal <130/<80

## 2021-09-21 ENCOUNTER — Ambulatory Visit: Payer: Self-pay

## 2021-09-21 ENCOUNTER — Other Ambulatory Visit: Payer: Self-pay | Admitting: Family Medicine

## 2021-09-21 DIAGNOSIS — H65112 Acute and subacute allergic otitis media (mucoid) (sanguinous) (serous), left ear: Secondary | ICD-10-CM

## 2021-09-21 MED ORDER — AMOXICILLIN-POT CLAVULANATE 875-125 MG PO TABS: 1.0000 | ORAL_TABLET | Freq: Two times a day (BID) | ORAL | 0 refills | Status: DC

## 2021-09-21 NOTE — Telephone Encounter (Signed)
Pt called back stating she is experiencing left ear pain, sore throat, and runny nose. She says she had a physical on 09/18/21 and Daneil Dan, NP had told her that her left ear was red but no action was needed right now. Pt stated that her ear started hurting Wednesday morning but became more persistent Thursday. She is wanting some antibiotics called into the pharmacy. Pt states she takes a daily zyrtec for allergies. Advised pt that scheduling an appt would be necessary to get antibiotics. D/t providers being booked and no availability pt could do E-visit or virtual urgent care visit through [employer]. Pt stated she would do a E-visit today. Care advice given and pt verbalized understanding.   Summary: ear pain   Patient called in has pain in left ear and is asking for antibiotics to be sent [id number] - [place], [place]  Phone: [phone]  Fax: [phone]       Reason for Disposition  Earache  (Exceptions: brief ear pain of < 60 minutes duration, earache occurring during air travel  Answer Assessment - Initial Assessment Questions 1. LOCATION: "Which ear is involved?"     Right  2. ONSET: "When did the ear start hurting"      Wednesday 3. SEVERITY: "How bad is the pain?"  (Scale 1-10; mild, moderate or severe)   - MILD (1-3): doesn't interfere with normal activities    - MODERATE (4-7): interferes with normal activities or awakens from sleep    - SEVERE (8-10): excruciating pain, unable to do any normal activities      6 4. URI SYMPTOMS: "Do you have a runny nose or cough?"     Runny nose, left side of throat sore 5. FEVER: "Do you have a fever?" If Yes, ask: "What is your temperature, how was it measured, and when did it start?"     No 6. CAUSE: "Have you been swimming recently?", "How often do you use Q-TIPS?", "Have you had any recent air travel or scuba diving?"     No 7. OTHER SYMPTOMS: "Do you have any other symptoms?" (e.g.,  headache, stiff neck, dizziness, vomiting, runny nose, decreased hearing)     No 8. PREGNANCY: "Is there any chance you are pregnant?" "When was your last menstrual period?"     No  Protocols used: Bethann Punches

## 2021-09-25 ENCOUNTER — Telehealth: Payer: Self-pay | Admitting: Neurology

## 2021-09-25 ENCOUNTER — Ambulatory Visit: Payer: 59 | Admitting: Neurology

## 2021-09-25 NOTE — Telephone Encounter (Signed)
Patient No- Showed. Rescheduled for January.

## 2021-09-25 NOTE — Telephone Encounter (Signed)
Pt no show due to may have been exposed to Covid.

## 2021-12-05 ENCOUNTER — Encounter: Payer: Self-pay | Admitting: Neurology

## 2021-12-05 ENCOUNTER — Ambulatory Visit: Payer: 59 | Admitting: Neurology

## 2021-12-05 VITALS — BP 163/92 | HR 61 | Ht 61.0 in | Wt 240.0 lb

## 2021-12-05 DIAGNOSIS — S5412XA Injury of median nerve at forearm level, left arm, initial encounter: Secondary | ICD-10-CM | POA: Diagnosis not present

## 2021-12-05 NOTE — Progress Notes (Signed)
[facility]    Provider:  Dr Jaynee Eagles Requesting Provider: Virginia Crews, MD Primary Care Provider:  Gwyneth Sprout, FNP  CC:  left arm radiating pain median distribution from ventral elbow crease after blood draw radiating to the carpal tunnel and digts 1-3  HPI:  Sarah DELEEUW is a 51 y.o. female here as requested by Virginia Crews, MD for neuropathy. PMHx neuropathy, elevated blood pressure without a diagnosis of hypertension, hypercholesterolemia, morbid obesity.  Patient no showed her first appointment.  I reviewed Dr. Nancy Nordmann notes: She has acute uncomplicated mononeuropathy in the left median nerve distribution, she was started on gabapentin as needed for nerve pain, I talked about the symptoms may self resolve over the next several weeks or months, they will continue to monitor, she was referred for nerve conduction studies if symptoms worsen or persist.  She is also morbidly obese.  From a review of labs her hemoglobin A1c was 6-year ago and as high as 6.2 in the last several years.  In August of last year she had blood draw and she had her arm and took blood and put a needle in and it was burning, continued since then, she had brusising in the mid crease of the arm, burning, radiates down the arm to thumb and wrist and digits 1-3.she has numbness and tingling in the left forearm and hand and fingers.weakness improved, she has an electric shock feeling, happens daily, depends on the weight, the numbness I continuous and at night she has to sleep a certain way, never had this before that day. Can be a little painful, tylenol helps, worse at night. No other focal neurologic deficits, associated symptoms, inciting events or modifiable factors. No neck pain or radicular symptms. No other focal neurologic deficits, associated symptoms, inciting events or modifiable factors.  Reviewed notes, labs and imaging from outside physicians, which showed: see  above  Review of Systems: Patient complains of symptoms per HPI as well as the following symptoms numbness. Pertinent negatives and positives per HPI. All others negative.   Social History   Socioeconomic History   Marital status: Single    Spouse name: Not on file   Number of children: Not on file   Years of education: Not on file   Highest education level: Not on file  Occupational History   Not on file  Tobacco Use   Smoking status: Never   Smokeless tobacco: Never  Substance and Sexual Activity   Alcohol use: No    Comment: occassional   Drug use: No   Sexual activity: Yes    Birth control/protection: Sarah Monroe, Surgical    Comment: Tubal ligation; monogomous relationship with female  Other Topics Concern   Not on file  Social History Narrative   Works remote with [employer].     Social Determinants of Health   Financial Resource Strain: Not on file  Food Insecurity: Not on file  Transportation Needs: Not on file  Physical Activity: Not on file  Stress: Not on file  Social Connections: Not on file  Intimate Partner Violence: Not on file    Family History  Problem Relation Age of Onset   Allergies Mother    Diabetes Mother        History of DM   Hyperlipidemia Mother    Hypertension Mother        But is well controlled due to diet/exercise   Colon polyps Mother    Hypertension Father    Diabetes  Father        Type 2   Hernia Father    Heart murmur Father    Dementia Father    Colon cancer Cousin 80   Hypertension Brother    Breast cancer Neg Hx     Past Medical History:  Diagnosis Date   Anemia    Anxiety    GERD (gastroesophageal reflux disease)    Hyperlipidemia    Pre-diabetes     Patient Active Problem List   Diagnosis Date Noted   Injury of median nerve at left forearm level 12/05/2021   Elevated BP without diagnosis of hypertension 07/23/2021   Neuropathy 07/23/2021   Fibroids 10/15/2018   Menometrorrhagia 10/15/2018   Morbidly obese ([place])  09/29/2015   Avitaminosis D 08/23/2015   Allergic rhinitis 08/22/2015   Hypercholesteremia 08/22/2015   Injury of knee, leg, ankle and foot 08/22/2015   Puberty bleeding 08/22/2015   Pain in joint, lower leg 08/22/2015    Past Surgical History:  Procedure Laterality Date   CESAREAN SECTION     x3   COLONOSCOPY WITH PROPOFOL N/A 10/18/2016   Procedure: COLONOSCOPY WITH PROPOFOL;  Surgeon: Jonathon Bellows, MD;  Location: [facility] ENDOSCOPY;  Service: Endoscopy;  Laterality: N/A;   Endometriotic cyst     Dr. Jamal Collin   GALLBLADDER SURGERY  1999   Dr. Kirtland Bouchard   Post cholcystectomy  1998   TUBAL LIGATION Bilateral     Current Outpatient Medications  Medication Sig Dispense Refill   amoxicillin-clavulanate (AUGMENTIN) 875-125 MG tablet Take 1 tablet by mouth 2 (two) times daily. 20 tablet 0   Cetirizine HCl 10 MG CAPS Take 10 mg by mouth daily as needed.     Cholecalciferol (VITAMIN D-3) 25 MCG (1000 UT) CAPS Take by mouth.     ferrous sulfate 325 (65 FE) MG EC tablet Take 325 mg by mouth 3 (three) times daily with meals.     gabapentin (NEURONTIN) 300 MG capsule Take 1 capsule (300 mg total) by mouth at bedtime. 30 capsule 2   pantoprazole (PROTONIX) 40 MG tablet Take 1 tablet (40 mg total) by mouth daily. 90 tablet 1   No current facility-administered medications for this visit.    Allergies as of 12/05/2021   (No Known Allergies)    Vitals: BP (!) 163/92    Pulse 61    Ht 5\' 1"  (1.549 m)    Wt 240 lb (108.9 kg)    BMI 45.35 kg/m  Last Weight:  Wt Readings from Last 1 Encounters:  12/05/21 240 lb (108.9 kg)   Last Height:   Ht Readings from Last 1 Encounters:  12/05/21 5\' 1"  (1.549 m)     Physical exam: Exam: Gen: NAD, conversant, well nourised, obese, well groomed                     CV: RRR, no MRG. No Carotid Bruits. No peripheral edema, warm, nontender Eyes: Conjunctivae clear without exudates or hemorrhage  Neuro: Detailed Neurologic Exam  Speech:    Speech is  normal; fluent and spontaneous with normal comprehension.  Cognition:    The patient is oriented to person, place, and time;     recent and remote memory intact;     language fluent;     normal attention, concentration,     fund of knowledge Cranial Nerves:    The pupils are equal, round, and reactive to light. The fundi are flat . Visual fields are full to finger confrontation. Extraocular  movements are intact. Trigeminal sensation is intact and the muscles of mastication are normal. The face is symmetric. The palate elevates in the midline. Hearing intact. Voice is normal. Shoulder shrug is normal. The tongue has normal motion without fasciculations.   Coordination:    Normal    Gait:    normal.   Motor Observation:    No asymmetry, no atrophy, and no involuntary movements noted. Tone:    Normal muscle tone.    Posture:    Posture is normal. normal erect    Strength: left weak grip and left weak opponens pollicis. Otherwise Strength is V/V in the upper and lower limbs.      Sensation: decrease sensation distribution median nerve from forearm to digits 1-2     Reflex Exam:  DTR's:    Deep tendon reflexes in the upper and lower extremities are normal bilaterally.   Toes:    The toes are downgoing bilaterally.   Clonus:    Clonus is absent.    Assessment/Plan:   Patient with left arm radiating pain median distribution from ventral elbow mid-crease after blood draw, symptoms radiating through the carpal tunnel to digts 1-3  - Patient with injury at ventral elbow crease, giving blood , pain at median nerve mid crease please perform ultrasound to look for cyst/scar tissue or anything impacting the median nerve at the ventral medial elbow crease at median nerve  -emg/ncs left arm (can compare to right arm if needed, injury at the elbow crease please measure above and below elbow crease as well  Orders Placed This Encounter  Procedures   [place] LT UPPER EXTREM LTD SOFT TISSUE NON  VASCULAR   NCV with EMG(electromyography)     Cc: Virginia Crews, MD,  Gwyneth Sprout, FNP  Sarina Ill, MD  [facility] [address] [place], [place] [address]  Phone [phone] Fax [phone]

## 2021-12-05 NOTE — Patient Instructions (Addendum)
Ultrasound EMG/NCS Median nerve injury at the elbow crease?    Electromyoneurogram Electromyoneurogram is a test to check how well your muscles and nerves are working. This procedure includes the combined use of electromyogram (EMG) and nerve conduction study (NCS). EMG is used to evaluate muscles and the nerves that control those muscles. NCS, which is also called electroneurogram, measures how well your nerves conduct electricity. The procedures should be done together to check if your muscles and nerves are healthy. If the results of the tests are abnormal, this may indicate disease or injury, such as a neuromuscular disease or peripheral nerve damage. Tell a health care provider about: Any allergies you have. All medicines you are taking, including vitamins, herbs, eye drops, creams, and over-the-counter medicines. Any bleeding problems you have. Any surgeries you have had. Any medical conditions you have. What are the risks? Generally, this is a safe procedure. However, problems may occur, including: Bleeding or bruising. Infection where the electrodes were inserted. What happens before the test? Medicines Take all of your usually prescribed medications before this testing is performed. Do not stop your blood thinners unless advised by your prescribing physician. General instructions Your health care provider may ask you to warm the limb that will be checked with warm water, hot pack, or wrapping the limb in a blanket. Do not use lotions or creams on the same day that you will be having the procedure. What happens during the test? For EMG  Your health care provider will ask you to stay in a position so that the muscle being studied can be accessed. You will be sitting or lying down. You may be given a medicine to numb the area (local anesthetic) and the skin will be disinfected. A very thin needle that has an electrode will be inserted into your muscle, one muscle at a time.  Typically, multiple muscles are evaluated during a single study. Another small electrode will be placed on your skin near the muscle. Your health care provider will ask you to continue to remain still. The electrodes will record the electrical activity of your muscles. You may see this on a monitor or hear it in the room. After your muscles have been studied at rest, your health care provider will ask you to contract or flex your muscles. The electrodes will record the electrical activity of your muscles. Your health care provider will remove the electrodes and the electrode needle when the procedure is finished. The procedure may vary among health care providers and hospitals. For NCS  An electrode that records your nerve activity (recording electrode) will be placed on your skin by the muscle that is being studied. An electrode that is used as a reference (reference electrode) will be placed near the recording electrode. A paste or gel will be applied to your skin between the recording electrode and the reference electrode. Your nerve will be stimulated with a mild shock. The speed of the nerves and strength of response is recorded by the electrodes. Your health care provider will remove the electrodes and the gel when the procedure is finished. The procedure may vary among health care providers and hospitals. What can I expect after the test? It is up to you to get your test results. Ask your health care provider, or the department that is doing the test, when your results will be ready. Your health care provider may: Give you medicines for any pain. Monitor the insertion sites to make sure that bleeding stops. You should  be able to drive yourself to and from the test. Discomfort can persist for a few hours after the test, but should be better the next day. Contact a health care provider if: You have swelling, redness, or drainage at any of the insertion sites. Summary Electromyoneurogram  is a test to check how well your muscles and nerves are working. If the results of the tests are abnormal, this may indicate disease or injury. This is a safe procedure. However, problems may occur, such as bleeding and infection. Your health care provider will do two tests to complete this procedure. One checks your muscles (EMG) and another checks your nerves (NCS). It is up to you to get your test results. Ask your health care provider, or the department that is doing the test, when your results will be ready. This information is not intended to replace advice given to you by your health care provider. Make sure you discuss any questions you have with your health care provider. Document Revised: 08/01/2021 Document Reviewed: 07/01/2021 Elsevier Patient Education  [place].

## 2021-12-12 ENCOUNTER — Other Ambulatory Visit: Payer: 59

## 2021-12-19 ENCOUNTER — Other Ambulatory Visit: Payer: Self-pay

## 2021-12-19 ENCOUNTER — Ambulatory Visit
Admission: RE | Admit: 2021-12-19 | Discharge: 2021-12-19 | Disposition: A | Discharge status: Home / Self Care | Payer: 59 | Source: Ambulatory Visit | Attending: Neurology | Admitting: Neurology

## 2021-12-19 DIAGNOSIS — S5412XA Injury of median nerve at forearm level, left arm, initial encounter: Secondary | ICD-10-CM

## 2021-12-20 ENCOUNTER — Ambulatory Visit: Payer: 59 | Admitting: Family Medicine

## 2021-12-20 NOTE — Progress Notes (Deleted)
°  ° ° °  Established patient visit   Patient: Sarah Monroe   DOB: Jan 14, 1971   51 y.o. Female  MRN: [id number] Visit Date: 12/20/2021  Today's healthcare provider: Gwyneth Sprout, FNP   No chief complaint on file.  Subjective    HPI  ***  Medications: Outpatient Medications Prior to Visit  Medication Sig   amoxicillin-clavulanate (AUGMENTIN) 875-125 MG tablet Take 1 tablet by mouth 2 (two) times daily.   Cetirizine HCl 10 MG CAPS Take 10 mg by mouth daily as needed.   Cholecalciferol (VITAMIN D-3) 25 MCG (1000 UT) CAPS Take by mouth.   ferrous sulfate 325 (65 FE) MG EC tablet Take 325 mg by mouth 3 (three) times daily with meals.   gabapentin (NEURONTIN) 300 MG capsule Take 1 capsule (300 mg total) by mouth at bedtime.   pantoprazole (PROTONIX) 40 MG tablet Take 1 tablet (40 mg total) by mouth daily.   No facility-administered medications prior to visit.    Review of Systems  {Labs   Heme   Chem   Endocrine   Serology   Results Review (optional):23779}   Objective    There were no vitals taken for this visit. {Show previous vital signs (optional):23777}  Physical Exam  ***  No results found for any visits on 12/20/21.  Assessment & Plan     ***  No follow-ups on file.      {provider attestation***:1}   Gwyneth Sprout, [place] [phone] (phone) [phone] (fax)  [place]

## 2022-01-21 ENCOUNTER — Ambulatory Visit (INDEPENDENT_AMBULATORY_CARE_PROVIDER_SITE_OTHER): Payer: 59 | Admitting: Family Medicine

## 2022-01-21 ENCOUNTER — Telehealth: Payer: Self-pay

## 2022-01-21 ENCOUNTER — Other Ambulatory Visit: Payer: Self-pay

## 2022-01-21 ENCOUNTER — Other Ambulatory Visit (HOSPITAL_COMMUNITY)
Admission: RE | Admit: 2022-01-21 | Discharge: 2022-01-21 | Disposition: A | Discharge status: Home / Self Care | Payer: 59 | Source: Ambulatory Visit | Attending: Family Medicine | Admitting: Family Medicine

## 2022-01-21 ENCOUNTER — Encounter: Payer: Self-pay | Admitting: Family Medicine

## 2022-01-21 VITALS — BP 139/84 | HR 63 | Temp 98.4°F | Resp 16 | Ht 61.0 in | Wt 230.0 lb

## 2022-01-21 DIAGNOSIS — R21 Rash and other nonspecific skin eruption: Secondary | ICD-10-CM

## 2022-01-21 DIAGNOSIS — R300 Dysuria: Secondary | ICD-10-CM

## 2022-01-21 DIAGNOSIS — Z202 Contact with and (suspected) exposure to infections with a predominantly sexual mode of transmission: Secondary | ICD-10-CM | POA: Diagnosis not present

## 2022-01-21 DIAGNOSIS — Z124 Encounter for screening for malignant neoplasm of cervix: Secondary | ICD-10-CM

## 2022-01-21 DIAGNOSIS — R8281 Pyuria: Secondary | ICD-10-CM

## 2022-01-21 LAB — POCT URINALYSIS DIPSTICK
Bilirubin, UA: NEGATIVE
Blood, UA: POSITIVE
Glucose, UA: NEGATIVE
Ketones, UA: NEGATIVE
Nitrite, UA: NEGATIVE
Protein, UA: POSITIVE — AB
Spec Grav, UA: 1.02 (ref 1.010–1.025)
Urobilinogen, UA: 0.2 E.U./dL
pH, UA: 6 (ref 5.0–8.0)

## 2022-01-21 MED ORDER — VALACYCLOVIR HCL 1 G PO TABS: 1000.0000 mg | ORAL_TABLET | Freq: Two times a day (BID) | ORAL | 0 refills | Status: AC

## 2022-01-21 NOTE — Telephone Encounter (Signed)
Please advise 

## 2022-01-21 NOTE — Telephone Encounter (Signed)
Returned call and answered questions 

## 2022-01-21 NOTE — Progress Notes (Signed)
° °  SUBJECTIVE:   CHIEF COMPLAINT / HPI:   URINARY SYMPTOMS Symptom onset with dysuria last Monday. Received Bactrim via virtual visit. Last dose Sunday. Still with symptoms. Wednesday of last week with vaginal discharge. Ate more yogurt without relief. Now with blistering rash in vulvar area as well as burning, itching. Preparation H made worse. No prior h/o STIs. Sexually active with one female partner. Does not wear condoms.    OBJECTIVE:   BP 139/84 (BP Location: Left Arm, Patient Position: Sitting, Cuff Size: Large)    Pulse 63    Temp 98.4 F (36.9 C) (Temporal)    Resp 16    Ht 5\' 1"  (1.549 m)    Wt 230 lb (104.3 kg)    SpO2 99%    BMI 43.46 kg/m   Gen: well appearing, in NAD GYN:  External genitalia with blistering, pustular rash with erythematous base extending along entirety of perineum with few blisters along proximal vaginal wall. Copious amounts of thick, white vaginal discharge. Unable to view entirety of cervix. No cervical motion tenderness. Bimanual exam with nongravid uterus. No adnexal masses bilaterally.    ASSESSMENT/PLAN:   Dysuria With pustular rash in perineum concerning for herpetic rash. Viral culture of lesion obtained. Pap smear updated with STI testing. Valtrex x10 days. With mod leuks on UA, sent for urine culture. Abstain from sex until rash and symptoms clear.    Myles Gip, DO

## 2022-01-21 NOTE — Patient Instructions (Signed)
It was great to see you!  Our plans for today:  - Take the antiviral for 10 days. Let us know if your symptoms persist past 10 days.    We are checking some labs today, we will release these results to your MyChart.  Take care and seek immediate care sooner if you develop any concerns.   Dr. Ky Barban

## 2022-01-21 NOTE — Telephone Encounter (Signed)
Copied from [place] [id number]. Topic: General - Other >> Jan 21, 2022  9:40 AM Yvette Rack wrote: Reason for CRM: Pt stated she needs to speak with Dr. Ky Barban regarding her appt today. Pt requests return call. Cb# [phone]

## 2022-01-22 ENCOUNTER — Encounter: Payer: Self-pay | Admitting: Family Medicine

## 2022-01-22 LAB — HEPATITIS C ANTIBODY: Hep C Virus Ab: NONREACTIVE

## 2022-01-22 LAB — HIV ANTIBODY (ROUTINE TESTING W REFLEX): HIV Screen 4th Generation wRfx: NONREACTIVE

## 2022-01-22 LAB — RPR: RPR Ser Ql: NONREACTIVE

## 2022-01-23 LAB — URINE CULTURE

## 2022-01-23 LAB — SPECIMEN STATUS REPORT

## 2022-01-23 LAB — HERPES SIMPLEX VIRUS CULTURE

## 2022-01-24 LAB — CYTOLOGY - PAP
Adequacy: ABSENT
Chlamydia: NEGATIVE
Comment: NEGATIVE
Comment: NEGATIVE
Comment: NEGATIVE
Comment: NEGATIVE
Comment: NORMAL
Diagnosis: NEGATIVE
HSV1: NEGATIVE
HSV2: POSITIVE — AB
High risk HPV: NEGATIVE
Neisseria Gonorrhea: NEGATIVE
Trichomonas: NEGATIVE

## 2022-01-25 ENCOUNTER — Other Ambulatory Visit: Payer: Self-pay | Admitting: Family Medicine

## 2022-01-25 DIAGNOSIS — B009 Herpesviral infection, unspecified: Secondary | ICD-10-CM

## 2022-01-25 MED ORDER — VALACYCLOVIR HCL 500 MG PO TABS: 500.0000 mg | ORAL_TABLET | Freq: Two times a day (BID) | ORAL | 5 refills | Status: DC

## 2022-01-31 ENCOUNTER — Ambulatory Visit (INDEPENDENT_AMBULATORY_CARE_PROVIDER_SITE_OTHER): Payer: Self-pay | Admitting: Neurology

## 2022-01-31 ENCOUNTER — Telehealth: Payer: Self-pay | Admitting: Neurology

## 2022-01-31 DIAGNOSIS — Z91199 Patient's noncompliance with other medical treatment and regimen due to unspecified reason: Secondary | ICD-10-CM

## 2022-01-31 NOTE — Telephone Encounter (Signed)
DO NOT RESCHEDULE. Multiple No Shows including new patient appointment and EMG and nerve conduction studies. If she calls, Discuss with new patient referrals or Dr. Jaynee Eagles. ?

## 2022-01-31 NOTE — Progress Notes (Signed)
NO SHOWED BOTH EMG AND NERVE CONDUCTIONS TODAY ALSO NO SHOWED PRIOR NEW PATIENT APPOINTMENT. I am recommending dismissal per office policy.  ?

## 2022-02-20 ENCOUNTER — Other Ambulatory Visit: Payer: Self-pay | Admitting: *Deleted

## 2022-02-20 DIAGNOSIS — B009 Herpesviral infection, unspecified: Secondary | ICD-10-CM

## 2022-02-20 NOTE — Telephone Encounter (Signed)
Error

## 2022-03-12 ENCOUNTER — Ambulatory Visit: Payer: Self-pay | Admitting: *Deleted

## 2022-03-12 NOTE — Telephone Encounter (Signed)
?  Chief Complaint: numbness and pins and needles feeling in both feet for a week that is constant ?Symptoms: above ?Frequency: constant now.   Was intermittent ?Pertinent Negatives: Patient denies having problems walking ?Disposition: '[]'$ ED /'[]'$ Urgent Care (no appt availability in office) / '[x]'$ Appointment(In office/virtual)/ '[]'$  [facility]/ '[]'$ Home Care/ '[]'$ Refused Recommended Disposition /'[]'$ [facility]/ '[]'$  Follow-up with PCP ?Additional Notes:   ?

## 2022-03-12 NOTE — Telephone Encounter (Signed)
Reason for Disposition ? [1] Numbness or tingling in one or both feet AND [2] is a chronic symptom (recurrent or ongoing AND present > 4 weeks) ?   Started a week ago. ? ?Answer Assessment - Initial Assessment Questions ?1. SYMPTOM: "What is the main symptom you are concerned about?" (e.g., weakness, numbness) ?    Having numbness and pins and needles feeling in feet that started last Monday.     ?2. ONSET: "When did this start?" (minutes, hours, days; while sleeping) ?    Last Monday ?3. LAST NORMAL: "When was the last time you (the patient) were normal (no symptoms)?" ?    I'm elevating my legs and using Epsom salts.  But the numbness is not going away. ?4. PATTERN "Does this come and go, or has it been constant since it started?"  "Is it present now?" ?    Constant.    No pain  pins and needles feeling only with the numbness.   I'm prediabetic. ?5. CARDIAC SYMPTOMS: "Have you had any of the following symptoms: chest pain, difficulty breathing, palpitations?" ?    *No Answer* ?6. NEUROLOGIC SYMPTOMS: "Have you had any of the following symptoms: headache, dizziness, vision loss, double vision, changes in speech, unsteady on your feet?" ?    *No Answer* ?7. OTHER SYMPTOMS: "Do you have any other symptoms?" ?    *No Answer* ?8. PREGNANCY: "Is there any chance you are pregnant?" "When was your last menstrual period?" ?    *No Answer* ? ?Protocols used: Neurologic Deficit-A-AH ? ?

## 2022-03-13 ENCOUNTER — Ambulatory Visit: Payer: 59 | Admitting: Family Medicine

## 2022-03-15 ENCOUNTER — Ambulatory Visit: Payer: 59 | Admitting: Family Medicine

## 2022-03-18 ENCOUNTER — Encounter: Payer: Self-pay | Admitting: Family Medicine

## 2022-03-18 ENCOUNTER — Ambulatory Visit: Payer: 59 | Admitting: Family Medicine

## 2022-03-18 ENCOUNTER — Ambulatory Visit: Payer: 59 | Admitting: Physician Assistant

## 2022-03-18 VITALS — BP 141/78 | HR 51 | Temp 98.0°F | Resp 16 | Wt 225.2 lb

## 2022-03-18 DIAGNOSIS — D509 Iron deficiency anemia, unspecified: Secondary | ICD-10-CM

## 2022-03-18 DIAGNOSIS — E7800 Pure hypercholesterolemia, unspecified: Secondary | ICD-10-CM | POA: Diagnosis not present

## 2022-03-18 DIAGNOSIS — G629 Polyneuropathy, unspecified: Secondary | ICD-10-CM | POA: Diagnosis not present

## 2022-03-18 DIAGNOSIS — E559 Vitamin D deficiency, unspecified: Secondary | ICD-10-CM

## 2022-03-18 DIAGNOSIS — I10 Essential (primary) hypertension: Secondary | ICD-10-CM | POA: Diagnosis not present

## 2022-03-18 DIAGNOSIS — R7303 Prediabetes: Secondary | ICD-10-CM

## 2022-03-18 DIAGNOSIS — T753XXA Motion sickness, initial encounter: Secondary | ICD-10-CM

## 2022-03-18 MED ORDER — SCOPOLAMINE 1 MG/3DAYS TD PT72: 1.0000 | MEDICATED_PATCH | TRANSDERMAL | 12 refills | Status: DC

## 2022-03-18 MED ORDER — MELOXICAM 15 MG PO TABS: 15.0000 mg | ORAL_TABLET | Freq: Every day | ORAL | 0 refills | Status: DC

## 2022-03-18 NOTE — Assessment & Plan Note (Signed)
Repeat FLP today ?We recommend diet low in saturated fat and regular exercise - 30 min at least 5 times per week ? ?

## 2022-03-18 NOTE — Assessment & Plan Note (Signed)
History of pre-diabetes ?Repeat A1c ?Purposeful weight loss noted in last 9 months ?Continue to recommend balanced, lower carb meals. Smaller meal size, adding snacks. Choosing water as drink of choice and increasing purposeful exercise. ? ?

## 2022-03-18 NOTE — Progress Notes (Signed)
?  ? ?[employer] as a Education administrator for Gwyneth Sprout, FNP.,have documented all relevant documentation on the behalf of Gwyneth Sprout, FNP,as directed by  Gwyneth Sprout, FNP while in the presence of Gwyneth Sprout, FNP.  ? ?Established patient visit ? ? ?Patient: Sarah Monroe   DOB: 09/01/1971   51 y.o. Female  MRN: [id number] ?Visit Date: 03/18/2022 ? ?Today's healthcare provider: Gwyneth Sprout, FNP  ? ?Re Introduced to nurse practitioner role and practice setting.  All questions answered.  Discussed provider/patient relationship and expectations. ? ? ?Chief Complaint  ?Patient presents with  ? Numbness  ?  Patient comes in office today with concerns of numbness and tingling in both feet for the past two weeks.   ? ?Subjective  ?  ?HPI ?HPI   ? ? Numbness   ? Additional comments: Patient comes in office today with concerns of numbness and tingling in both feet for the past two weeks.  ? ?  ?  ?Last edited by Minette Headland, CMA on 03/18/2022 11:11 AM.  ?  ?  ? ?Medications: ?Outpatient Medications Prior to Visit  ?Medication Sig  ? Cetirizine HCl 10 MG CAPS Take 10 mg by mouth daily as needed.  ? Cholecalciferol (VITAMIN D-3) 25 MCG (1000 UT) CAPS Take by mouth.  ? ferrous sulfate 325 (65 FE) MG EC tablet Take 325 mg by mouth 3 (three) times daily with meals.  ? gabapentin (NEURONTIN) 300 MG capsule Take 1 capsule (300 mg total) by mouth at bedtime.  ? pantoprazole (PROTONIX) 40 MG tablet Take 1 tablet (40 mg total) by mouth daily.  ? valACYclovir (VALTREX) 500 MG tablet Take 1 tablet (500 mg total) by mouth 2 (two) times daily. Use at onset of recurrent outbreak. Take medications 12 hours apart.  ? ?No facility-administered medications prior to visit.  ? ? ?Review of Systems ? ?Last CBC ?Lab Results  ?Component Value Date  ? WBC 4.0 07/28/2020  ? HGB 13.8 07/28/2020  ? HCT 42.0 07/28/2020  ? MCV 83 07/28/2020  ? MCH 27.2 07/28/2020  ? RDW 14.1 07/28/2020  ? PLT 278 07/28/2020  ? ?Last metabolic  panel ?Lab Results  ?Component Value Date  ? GLUCOSE 104 (H) 07/28/2020  ? NA 140 07/28/2020  ? K 4.2 07/28/2020  ? CL 102 07/28/2020  ? CO2 23 07/28/2020  ? BUN 9 07/28/2020  ? CREATININE 0.76 07/28/2020  ? GFRNONAA 93 07/28/2020  ? CALCIUM 10.1 07/28/2020  ? PROT 7.3 07/28/2020  ? ALBUMIN 4.4 07/28/2020  ? LABGLOB 2.9 07/28/2020  ? AGRATIO 1.5 07/28/2020  ? BILITOT 0.6 07/28/2020  ? ALKPHOS 69 07/28/2020  ? AST 18 07/28/2020  ? ALT 20 07/28/2020  ? ANIONGAP 8 08/13/2019  ? ?Last lipids ?Lab Results  ?Component Value Date  ? CHOL 212 (H) 07/28/2020  ? HDL 61 07/28/2020  ? LDLCALC 133 (H) 07/28/2020  ? TRIG 102 07/28/2020  ? CHOLHDL 3.4 09/21/2018  ? ?Last hemoglobin A1c ?Lab Results  ?Component Value Date  ? HGBA1C 6.0 (H) 07/28/2020  ? ?Last thyroid functions ?Lab Results  ?Component Value Date  ? TSH 0.717 07/28/2020  ? ?Last vitamin D ?Lab Results  ?Component Value Date  ? VD25OH 22.5 (L) 09/21/2018  ? ?Last vitamin B12 and Folate ?No results found for: VITAMINB12, FOLATE ?  ?  Objective  ?  ?BP (!) 141/78   Pulse (!) 51   Temp 98 ?F (36.7 ?C) (Oral)   Resp  16   Wt 225 lb 3.2 oz (102.2 kg)   SpO2 100%   BMI 42.55 kg/m?  ?BP Readings from Last 3 Encounters:  ?03/18/22 (!) 141/78  ?01/21/22 139/84  ?12/05/21 (!) 163/92  ? ?Wt Readings from Last 3 Encounters:  ?03/18/22 225 lb 3.2 oz (102.2 kg)  ?01/21/22 230 lb (104.3 kg)  ?12/05/21 240 lb (108.9 kg)  ? ?SpO2 Readings from Last 3 Encounters:  ?03/18/22 100%  ?01/21/22 99%  ?07/24/20 100%  ? ?  ? ?Physical Exam ?Vitals and nursing note reviewed.  ?Constitutional:   ?   General: She is not in acute distress. ?   Appearance: Normal appearance. She is obese. She is not ill-appearing, toxic-appearing or diaphoretic.  ?HENT:  ?   Head: Normocephalic and atraumatic.  ?Cardiovascular:  ?   Rate and Rhythm: Normal rate and regular rhythm.  ?   Pulses: Normal pulses.  ?   Heart sounds: Normal heart sounds. No murmur heard. ?  No friction rub. No gallop.  ?Pulmonary:   ?   Effort: Pulmonary effort is normal. No respiratory distress.  ?   Breath sounds: Normal breath sounds. No stridor. No wheezing, rhonchi or rales.  ?Chest:  ?   Chest wall: No tenderness.  ?Abdominal:  ?   General: Bowel sounds are normal.  ?   Palpations: Abdomen is soft.  ?Musculoskeletal:     ?   General: No swelling, tenderness, deformity or signs of injury. Normal range of motion.  ?   Right lower leg: Normal. No edema.  ?   Left lower leg: Normal. No edema.  ?   Right ankle: Normal.  ?   Left ankle: Normal.  ?   Right foot: Normal.  ?   Left foot: Normal.  ?Skin: ?   General: Skin is warm and dry.  ?   Capillary Refill: Capillary refill takes less than 2 seconds.  ?   Coloration: Skin is not jaundiced or pale.  ?   Findings: No bruising, erythema, lesion or rash.  ?Neurological:  ?   General: No focal deficit present.  ?   Mental Status: She is alert and oriented to person, place, and time. Mental status is at baseline.  ?   Cranial Nerves: No cranial nerve deficit.  ?   Sensory: No sensory deficit.  ?   Motor: No weakness.  ?   Coordination: Coordination normal.  ?Psychiatric:     ?   Mood and Affect: Mood normal.     ?   Behavior: Behavior normal.     ?   Thought Content: Thought content normal.     ?   Judgment: Judgment normal.  ?  ? ?No results found for any visits on 03/18/22. ? Assessment & Plan  ?  ? ?Problem List Items Addressed This Visit   ? ?  ? Cardiovascular and Mediastinum  ? Primary hypertension  ?  Chronic, borderline today 141/78 ?Denies CP ?Denies SOB/ DOE ?Denies low blood pressure/hypotension ?Denies vision changes ?No LE Edema noted on exam ?Continue low sodium diet and exercise ?Goal <140/<90 ?Recommend home blood pressure monitoring to assist ?If remains elevated; recommend start on HCTZ/ARB medication ?Seek emergent care if you develop chest pain or chest pressure ? ?  ?  ? Relevant Orders  ? Comprehensive Metabolic Panel (CMET)  ?  ? Nervous and Auditory  ? Neuropathy - Primary  ?   Acute concern, undiagnosed- recommend use of supportive shoes, stretching, compression hose ?8  day hx in bilateral feet/legs ?No loss of sensation ?Slightly cool temperature ?Normal pulses ?No edema ?Normal 5/5 plantar and dorsiflexion  ?Recommend 1 month of Mobic 15 mg to assist ?Will check labs today ?Refer to neuro if prolonged course for EMG ? ? ?  ?  ? Relevant Medications  ? meloxicam (MOBIC) 15 MG tablet  ? Other Relevant Orders  ? CBC with Differential/Platelet  ? B12 and Folate Panel  ? Vitamin B6  ?  ? Other  ? Avitaminosis D  ?  Hx of Avita D; previously on 1000 mcg/daily ?Repeat labs given new complaint of numbness/tingling in BLE ? ?  ?  ? Relevant Orders  ? Vitamin D (25 hydroxy)  ? Hypercholesteremia  ?  Repeat FLP today ?We recommend diet low in saturated fat and regular exercise - 30 min at least 5 times per week ? ? ?  ?  ? Relevant Orders  ? Lipid panel  ? Iron deficiency anemia  ?  Last labs stable; repeat CBC and iron panel ?Not currently on oral supplement ?Denies bleeding/bruising  ? ?  ?  ? Relevant Orders  ? CBC with Differential/Platelet  ? Morbidly obese ([place])  ?  Body mass index is 42.55 kg/m?. ?Associated with HTN, neuropathy, pre-diabetes ?Discussed importance of healthy weight management ?Discussed diet and exercise ? ?  ?  ? Relevant Orders  ? Lipid panel  ? Motion sickness  ?  Upcoming cruise; concern for motion sickness d/t cruise ship ? ? ?  ?  ? Relevant Medications  ? scopolamine (TRANSDERM-SCOP) 1 MG/3DAYS  ? Prediabetes  ?  History of pre-diabetes ?Repeat A1c ?Purposeful weight loss noted in last 9 months ?Continue to recommend balanced, lower carb meals. Smaller meal size, adding snacks. Choosing water as drink of choice and increasing purposeful exercise. ? ? ?  ?  ? Relevant Orders  ? Hemoglobin A1c  ? ? ? ?Return in about 3 months (around 06/17/2022) for chonic disease management.  ?   ? ?I, Gwyneth Sprout, FNP, have reviewed all documentation for this visit. The  documentation on 03/18/22 for the exam, diagnosis, procedures, and orders are all accurate and complete. ? ?Gwyneth Sprout, FNP  ?[place] ?[phone] (phone) ?[phone] (fax) ? ?[place]

## 2022-03-18 NOTE — Assessment & Plan Note (Signed)
Upcoming cruise; concern for motion sickness d/t cruise ship ? ?

## 2022-03-18 NOTE — Assessment & Plan Note (Signed)
Body mass index is 42.55 kg/m?. ?Associated with HTN, neuropathy, pre-diabetes ?Discussed importance of healthy weight management ?Discussed diet and exercise ? ?

## 2022-03-18 NOTE — Assessment & Plan Note (Signed)
Acute concern, undiagnosed- recommend use of supportive shoes, stretching, compression hose ?8 day hx in bilateral feet/legs ?No loss of sensation ?Slightly cool temperature ?Normal pulses ?No edema ?Normal 5/5 plantar and dorsiflexion  ?Recommend 1 month of Mobic 15 mg to assist ?Will check labs today ?Refer to neuro if prolonged course for EMG ? ?

## 2022-03-18 NOTE — Assessment & Plan Note (Signed)
Chronic, borderline today 141/78 ?Denies CP ?Denies SOB/ DOE ?Denies low blood pressure/hypotension ?Denies vision changes ?No LE Edema noted on exam ?Continue low sodium diet and exercise ?Goal <140/<90 ?Recommend home blood pressure monitoring to assist ?If remains elevated; recommend start on HCTZ/ARB medication ?Seek emergent care if you develop chest pain or chest pressure ? ?

## 2022-03-18 NOTE — Assessment & Plan Note (Signed)
Hx of Avita D; previously on 1000 mcg/daily ?Repeat labs given new complaint of numbness/tingling in BLE ?

## 2022-03-18 NOTE — Assessment & Plan Note (Signed)
Last labs stable; repeat CBC and iron panel ?Not currently on oral supplement ?Denies bleeding/bruising  ?

## 2022-03-20 LAB — LIPID PANEL
Chol/HDL Ratio: 3.4 ratio (ref 0.0–4.4)
Cholesterol, Total: 219 mg/dL — ABNORMAL HIGH (ref 100–199)
HDL: 65 mg/dL (ref >39)
LDL Chol Calc (NIH): 144 mg/dL — ABNORMAL HIGH (ref 0–99)
Triglycerides: 58 mg/dL (ref 0–149)
VLDL Cholesterol Cal: 10 mg/dL (ref 5–40)

## 2022-03-20 LAB — VITAMIN B6: Vitamin B6: 6.3 ug/L (ref 3.4–65.2)

## 2022-03-20 LAB — COMPREHENSIVE METABOLIC PANEL
ALT: 16 IU/L (ref 0–32)
AST: 14 IU/L (ref 0–40)
Albumin/Globulin Ratio: 1.5 (ref 1.2–2.2)
Albumin: 4.2 g/dL (ref 3.8–4.8)
Alkaline Phosphatase: 67 IU/L (ref 44–121)
BUN/Creatinine Ratio: 14 (ref 9–23)
BUN: 11 mg/dL (ref 6–24)
Bilirubin Total: 0.4 mg/dL (ref 0.0–1.2)
CO2: 25 mmol/L (ref 20–29)
Calcium: 10.2 mg/dL (ref 8.7–10.2)
Chloride: 103 mmol/L (ref 96–106)
Creatinine, Ser: 0.76 mg/dL (ref 0.57–1.00)
Globulin, Total: 2.8 g/dL (ref 1.5–4.5)
Glucose: 92 mg/dL (ref 70–99)
Potassium: 4.6 mmol/L (ref 3.5–5.2)
Sodium: 141 mmol/L (ref 134–144)
Total Protein: 7 g/dL (ref 6.0–8.5)
eGFR: 96 mL/min/{1.73_m2} (ref >59)

## 2022-03-20 LAB — HEMOGLOBIN A1C
Est. average glucose Bld gHb Est-mCnc: 120 mg/dL
Hgb A1c MFr Bld: 5.8 % — ABNORMAL HIGH (ref 4.8–5.6)

## 2022-03-20 LAB — CBC WITH DIFFERENTIAL/PLATELET
Basophils Absolute: 0 10*3/uL (ref 0.0–0.2)
Basos: 1 %
EOS (ABSOLUTE): 0.2 10*3/uL (ref 0.0–0.4)
Eos: 4 %
Hematocrit: 41.5 % (ref 34.0–46.6)
Hemoglobin: 13.8 g/dL (ref 11.1–15.9)
Immature Grans (Abs): 0 10*3/uL (ref 0.0–0.1)
Immature Granulocytes: 0 %
Lymphocytes Absolute: 1.5 10*3/uL (ref 0.7–3.1)
Lymphs: 35 %
MCH: 27.5 pg (ref 26.6–33.0)
MCHC: 33.3 g/dL (ref 31.5–35.7)
MCV: 83 fL (ref 79–97)
Monocytes Absolute: 0.4 10*3/uL (ref 0.1–0.9)
Monocytes: 8 %
Neutrophils Absolute: 2.2 10*3/uL (ref 1.4–7.0)
Neutrophils: 52 %
Platelets: 270 10*3/uL (ref 150–450)
RBC: 5.02 x10E6/uL (ref 3.77–5.28)
RDW: 13.1 % (ref 11.7–15.4)
WBC: 4.2 10*3/uL (ref 3.4–10.8)

## 2022-03-20 LAB — VITAMIN D 25 HYDROXY (VIT D DEFICIENCY, FRACTURES): Vit D, 25-Hydroxy: 14.5 ng/mL — ABNORMAL LOW (ref 30.0–100.0)

## 2022-03-20 LAB — B12 AND FOLATE PANEL
Folate: 7.2 ng/mL (ref >3.0)
Vitamin B-12: 455 pg/mL (ref 232–1245)

## 2022-04-16 ENCOUNTER — Other Ambulatory Visit: Payer: Self-pay | Admitting: Family Medicine

## 2022-04-16 DIAGNOSIS — G629 Polyneuropathy, unspecified: Secondary | ICD-10-CM

## 2022-04-17 ENCOUNTER — Other Ambulatory Visit: Payer: Self-pay | Admitting: Family Medicine

## 2022-04-17 ENCOUNTER — Encounter: Payer: Self-pay | Admitting: Family Medicine

## 2022-04-17 DIAGNOSIS — B009 Herpesviral infection, unspecified: Secondary | ICD-10-CM

## 2022-04-17 MED ORDER — VALACYCLOVIR HCL 500 MG PO TABS: 500.0000 mg | ORAL_TABLET | Freq: Every day | ORAL | 3 refills | Status: DC

## 2022-04-17 NOTE — Telephone Encounter (Signed)
Requested Prescriptions  ?Pending Prescriptions Disp Refills  ?? meloxicam (MOBIC) 15 MG tablet [Pharmacy Med Name: MELOXICAM 15 MG TABLET] 30 tablet 0  ?  Sig: TAKE 1 TABLET (15 MG TOTAL) BY MOUTH DAILY.  ?  ? Analgesics:  COX2 Inhibitors Failed - 04/16/2022  2:19 AM  ?  ?  Failed - Manual Review: Labs are only required if the patient has taken medication for more than 8 weeks.  ?  ?  Passed - HGB in normal range and within 360 days  ?  Hemoglobin  ?Date Value Ref Range Status  ?03/18/2022 13.8 11.1 - 15.9 g/dL Final  ?   ?  ?  Passed - Cr in normal range and within 360 days  ?  Creatinine, Ser  ?Date Value Ref Range Status  ?03/18/2022 0.76 0.57 - 1.00 mg/dL Final  ?   ?  ?  Passed - HCT in normal range and within 360 days  ?  Hematocrit  ?Date Value Ref Range Status  ?03/18/2022 41.5 34.0 - 46.6 % Final  ?   ?  ?  Passed - AST in normal range and within 360 days  ?  AST  ?Date Value Ref Range Status  ?03/18/2022 14 0 - 40 IU/L Final  ?   ?  ?  Passed - ALT in normal range and within 360 days  ?  ALT  ?Date Value Ref Range Status  ?03/18/2022 16 0 - 32 IU/L Final  ?   ?  ?  Passed - eGFR is 30 or above and within 360 days  ?  GFR calc Af Amer  ?Date Value Ref Range Status  ?07/28/2020 107 >59 mL/min/1.73 Final  ?  Comment:  ?  **[facility] currently reports eGFR in compliance with the current** ?  recommendations of the [employer]. [facility] will ?  update reporting as new guidelines are published from the NKF-ASN ?  Task force. ?  ? ?GFR calc non Af Amer  ?Date Value Ref Range Status  ?07/28/2020 93 >59 mL/min/1.73 Final  ? ?eGFR  ?Date Value Ref Range Status  ?03/18/2022 96 >59 mL/min/1.73 Final  ?   ?  ?  Passed - Patient is not pregnant  ?  ?  Passed - Valid encounter within last 12 months  ?  Recent Outpatient Visits   ?      ? 1 month ago Neuropathy  ? [facility] Tally Joe T, FNP  ? 2 months ago Dysuria  ? [place], DO  ? 7 months ago Encounter  for screening mammogram for malignant neoplasm of breast  ? [facility] Gwyneth Sprout, FNP  ? 8 months ago Elevated BP without diagnosis of hypertension  ? [facility] [place], Dionne Bucy, MD  ? 1 year ago Annual physical exam  ? [facility] [place], Clearnce Sorrel, [place]  ?  ?  ? ?  ?  ?  ? ? ? ?

## 2022-07-11 NOTE — Progress Notes (Signed)
Complete physical exam  Patient: Sarah Monroe   DOB: July 21, 1971   51 y.o. Female  MRN: [id number] Visit Date: 07/12/2022  Today's healthcare provider: Gwyneth Sprout, FNP  Introduced to nurse practitioner role and practice setting.  All questions answered.  Discussed provider/patient relationship and expectations.  I,Tiffany J Bragg,acting as a scribe for Gwyneth Sprout, FNP.,have documented all relevant documentation on the behalf of Gwyneth Sprout, FNP,as directed by  Gwyneth Sprout, FNP while in the presence of Gwyneth Sprout, FNP.  Subjective    Sarah Monroe is a 51 y.o. female who presents today for a complete physical exam.  She reports consuming a general diet. Gym/ health club routine includes cardio and light weights. She generally feels well. She reports sleeping well. She does have additional problems to discuss today.   HPI   Past Medical History:  Diagnosis Date   Anemia    Anxiety    GERD (gastroesophageal reflux disease)    Hyperlipidemia    Pre-diabetes    Past Surgical History:  Procedure Laterality Date   CESAREAN SECTION     x3   COLONOSCOPY WITH PROPOFOL N/A 10/18/2016   Procedure: COLONOSCOPY WITH PROPOFOL;  Surgeon: Jonathon Bellows, MD;  Location: [facility] ENDOSCOPY;  Service: Endoscopy;  Laterality: N/A;   Endometriotic cyst     Dr. Jamal Collin   GALLBLADDER SURGERY  1999   Dr. Kirtland Bouchard   Post cholcystectomy  1998   TUBAL LIGATION Bilateral    Social History   Socioeconomic History   Marital status: Single    Spouse name: Not on file   Number of children: Not on file   Years of education: Not on file   Highest education level: Not on file  Occupational History   Not on file  Tobacco Use   Smoking status: Never   Smokeless tobacco: Never  Substance and Sexual Activity   Alcohol use: No    Comment: occassional   Drug use: No   Sexual activity: Yes    Birth control/protection: None, Surgical    Comment: Tubal ligation; monogomous relationship with  female  Other Topics Concern   Not on file  Social History Narrative   Works remote with [employer].     Social Determinants of Health   Financial Resource Strain: Not on file  Food Insecurity: Not on file  Transportation Needs: Not on file  Physical Activity: Not on file  Stress: Not on file  Social Connections: Not on file  Intimate Partner Violence: Not on file   Family Status  Relation Name Status   Mother  Alive   Father  Deceased   MGM  Deceased at age 37       Pancreatic cancer   MGF  Deceased at age 72       COPD   PGM  Deceased at age 65       Coronary artery disease;Myocardial Infarction   PGF  Deceased       died of unknown causes; died before patient was born   Nutritional therapist   Sister  Alive   Brother  Alive   Brother  Alive   Brother  Alive   Brother  Alive   Sister  Alive   Neg Hx  (Not Specified)   Family History  Problem Relation Age of Onset   Allergies Mother    Diabetes Mother        History of DM   Hyperlipidemia Mother  Hypertension Mother        But is well controlled due to diet/exercise   Colon polyps Mother    Hypertension Father    Diabetes Father        Type 2   Hernia Father    Heart murmur Father    Dementia Father    Colon cancer Cousin 25   Hypertension Brother    Breast cancer Neg Hx    No Known Allergies  Patient Care Team: Gwyneth Sprout, FNP as PCP - General (Family Medicine)   Medications: Outpatient Medications Prior to Visit  Medication Sig   Cetirizine HCl 10 MG CAPS Take 10 mg by mouth daily as needed.   Cholecalciferol (VITAMIN D-3) 25 MCG (1000 UT) CAPS Take by mouth.   ferrous sulfate 325 (65 FE) MG EC tablet Take 325 mg by mouth 3 (three) times daily with meals.   gabapentin (NEURONTIN) 300 MG capsule Take 1 capsule (300 mg total) by mouth at bedtime.   scopolamine (TRANSDERM-SCOP) 1 MG/3DAYS Place 1 patch (1.5 mg total) onto the skin every 3 (three) days.   valACYclovir (VALTREX) 500 MG tablet Take 1 tablet  (500 mg total) by mouth daily.   [DISCONTINUED] meloxicam (MOBIC) 15 MG tablet TAKE 1 TABLET (15 MG TOTAL) BY MOUTH DAILY.   [DISCONTINUED] pantoprazole (PROTONIX) 40 MG tablet Take 1 tablet (40 mg total) by mouth daily.   No facility-administered medications prior to visit.   Review of Systems  Last CBC Lab Results  Component Value Date   WBC 4.2 03/18/2022   HGB 13.8 03/18/2022   HCT 41.5 03/18/2022   MCV 83 03/18/2022   MCH 27.5 03/18/2022   RDW 13.1 03/18/2022   PLT 270 65/01/5464   Last metabolic panel Lab Results  Component Value Date   GLUCOSE 92 03/18/2022   NA 141 03/18/2022   K 4.6 03/18/2022   CL 103 03/18/2022   CO2 25 03/18/2022   BUN 11 03/18/2022   CREATININE 0.76 03/18/2022   EGFR 96 03/18/2022   CALCIUM 10.2 03/18/2022   PROT 7.0 03/18/2022   ALBUMIN 4.2 03/18/2022   LABGLOB 2.8 03/18/2022   AGRATIO 1.5 03/18/2022   BILITOT 0.4 03/18/2022   ALKPHOS 67 03/18/2022   AST 14 03/18/2022   ALT 16 03/18/2022   ANIONGAP 8 08/13/2019   Last lipids Lab Results  Component Value Date   CHOL 219 (H) 03/18/2022   HDL 65 03/18/2022   LDLCALC 144 (H) 03/18/2022   TRIG 58 03/18/2022   CHOLHDL 3.4 03/18/2022   Last hemoglobin A1c Lab Results  Component Value Date   HGBA1C 5.8 (H) 03/18/2022   Last thyroid functions Lab Results  Component Value Date   TSH 0.717 07/28/2020   Last vitamin D Lab Results  Component Value Date   VD25OH 14.5 (L) 03/18/2022   Last vitamin B12 and Folate Lab Results  Component Value Date   VITAMINB12 455 03/18/2022   FOLATE 7.2 03/18/2022    Objective     BP 134/77 (BP Location: Right Arm, Patient Position: Sitting, Cuff Size: Large)   Pulse 63   Temp 97.6 F (36.4 C) (Oral)   Resp 16   Ht 5' 1"  (1.549 m)   Wt 239 lb (108.4 kg)   SpO2 98%   BMI 45.16 kg/m   BP Readings from Last 3 Encounters:  07/12/22 134/77  03/18/22 (!) 141/78  01/21/22 139/84   Wt Readings from Last 3 Encounters:  07/12/22 239 lb  (108.4 kg)  03/18/22 225 lb 3.2 oz (102.2 kg)  01/21/22 230 lb (104.3 kg)   SpO2 Readings from Last 3 Encounters:  07/12/22 98%  03/18/22 100%  01/21/22 99%   Physical Exam Vitals and nursing note reviewed.  Constitutional:      General: She is awake. She is not in acute distress.    Appearance: Normal appearance. She is well-developed and well-groomed. She is obese. She is not ill-appearing, toxic-appearing or diaphoretic.  HENT:     Head: Normocephalic and atraumatic.     Jaw: There is normal jaw occlusion. No trismus, tenderness, swelling or pain on movement.     Right Ear: Hearing, tympanic membrane, ear canal and external ear normal. There is no impacted cerumen.     Left Ear: Hearing, tympanic membrane, ear canal and external ear normal. There is no impacted cerumen.     Nose: Nose normal. No congestion or rhinorrhea.     Right Turbinates: Not enlarged, swollen or pale.     Left Turbinates: Not enlarged, swollen or pale.     Right Sinus: No maxillary sinus tenderness or frontal sinus tenderness.     Left Sinus: No maxillary sinus tenderness or frontal sinus tenderness.     Mouth/Throat:     Lips: Pink.     Mouth: Mucous membranes are moist. No injury.     Tongue: No lesions.     Pharynx: Oropharynx is clear. Uvula midline. No pharyngeal swelling, oropharyngeal exudate, posterior oropharyngeal erythema or uvula swelling.     Tonsils: No tonsillar exudate or tonsillar abscesses.  Eyes:     General: Lids are normal. Lids are everted, no foreign bodies appreciated. Vision grossly intact. Gaze aligned appropriately. No allergic shiner or visual field deficit.       Right eye: No discharge.        Left eye: No discharge.     Extraocular Movements: Extraocular movements intact.     Conjunctiva/sclera: Conjunctivae normal.     Right eye: Right conjunctiva is not injected. No exudate.    Left eye: Left conjunctiva is not injected. No exudate.    Pupils: Pupils are equal, round, and  reactive to light.  Neck:     Thyroid: No thyroid mass, thyromegaly or thyroid tenderness.     Vascular: No carotid bruit.     Trachea: Trachea normal.  Cardiovascular:     Rate and Rhythm: Normal rate and regular rhythm.     Pulses: Normal pulses.          Carotid pulses are 2+ on the right side and 2+ on the left side.      Radial pulses are 2+ on the right side and 2+ on the left side.       Dorsalis pedis pulses are 2+ on the right side and 2+ on the left side.       Posterior tibial pulses are 2+ on the right side and 2+ on the left side.     Heart sounds: Normal heart sounds, S1 normal and S2 normal. No murmur heard.    No friction rub. No gallop.  Pulmonary:     Effort: Pulmonary effort is normal. No respiratory distress.     Breath sounds: Normal breath sounds and air entry. No stridor. No wheezing, rhonchi or rales.  Chest:     Chest wall: No tenderness.     Comments: Breasts: risk and benefit of breast self-exam was discussed, not examined, patient declines to have breast exam  Abdominal:  General: Abdomen is flat. Bowel sounds are normal. There is no distension.     Palpations: Abdomen is soft. There is no mass.     Tenderness: There is no abdominal tenderness. There is no right CVA tenderness, left CVA tenderness, guarding or rebound.     Hernia: No hernia is present.  Genitourinary:    Comments: Exam deferred; denies complaints of HSV Musculoskeletal:        General: No swelling, tenderness, deformity or signs of injury. Normal range of motion.     Cervical back: Full passive range of motion without pain, normal range of motion and neck supple. No edema, rigidity or tenderness. No muscular tenderness.     Right lower leg: No edema.     Left lower leg: No edema.  Lymphadenopathy:     Cervical: No cervical adenopathy.     Right cervical: No superficial, deep or posterior cervical adenopathy.    Left cervical: No superficial, deep or posterior cervical adenopathy.   Skin:    General: Skin is warm and dry.     Capillary Refill: Capillary refill takes less than 2 seconds.     Coloration: Skin is not jaundiced or pale.     Findings: No bruising, erythema, lesion or rash.  Neurological:     General: No focal deficit present.     Mental Status: She is alert and oriented to person, place, and time. Mental status is at baseline.     GCS: GCS eye subscore is 4. GCS verbal subscore is 5. GCS motor subscore is 6.     Sensory: Sensation is intact. No sensory deficit.     Motor: Motor function is intact. No weakness.     Coordination: Coordination is intact. Coordination normal.     Gait: Gait is intact. Gait normal.  Psychiatric:        Attention and Perception: Attention and perception normal.        Mood and Affect: Mood and affect normal.        Speech: Speech normal.        Behavior: Behavior normal. Behavior is cooperative.        Thought Content: Thought content normal.        Cognition and Memory: Cognition and memory normal.        Judgment: Judgment normal.    Last depression screening scores    07/12/2022    2:19 PM 01/21/2022    9:28 AM 07/23/2021    1:15 PM  PHQ 2/9 Scores  PHQ - 2 Score 0 0 0  PHQ- 9 Score 2 0 0   Last fall risk screening    07/12/2022    2:19 PM  [place] in the past year? 0  Number falls in past yr: 0  Injury with Fall? 0   Last Audit-C alcohol use screening    07/12/2022    2:20 PM  Alcohol Use Disorder Test (AUDIT)  1. How often do you have a drink containing alcohol? 1  2. How many drinks containing alcohol do you have on a typical day when you are drinking? 0  3. How often do you have six or more drinks on one occasion? 0  AUDIT-C Score 1   A score of 3 or more in women, and 4 or more in men indicates increased risk for alcohol abuse, EXCEPT if all of the points are from question 1   No results found for any visits on 07/12/22.  Assessment &  Plan    Routine Health Maintenance and Physical  Exam  Exercise Activities and Dietary recommendations  Goals      Exercise 150 minutes per week (moderate activity)        Immunization History  Administered Date(s) Administered   Influenza,inj,Quad PF,6+ Mos 12/31/2013, 08/23/2015   PFIZER(Purple Top)SARS-COV-2 Vaccination 05/13/2020, 06/03/2020   Td 01/16/2005   Tdap 12/31/2013    Health Maintenance  Topic Date Due   COLONOSCOPY (Pts 45-66yr Insurance coverage will need to be confirmed)  10/18/2021   Zoster Vaccines- Shingrix (1 of 2) Never done   INFLUENZA VACCINE  07/02/2022   COVID-19 Vaccine (3 - Pfizer series) 01/21/2023 (Originally 07/29/2020)   MAMMOGRAM  10/16/2022   TETANUS/TDAP  01/01/2024   PAP SMEAR-Modifier  01/21/2025   Hepatitis C Screening  Completed   HIV Screening  Completed   HPV VACCINES  Aged Out    Discussed health benefits of physical activity, and encouraged her to engage in regular exercise appropriate for her age and condition.  Problem List Items Addressed This Visit       Cardiovascular and Mediastinum   Primary hypertension    Chronic, borderline with pre-diabetes Goal <130/<80 Repeat CMP Continue to address diet/exercise to assist      Relevant Orders   Comprehensive metabolic panel   Lipid panel     Digestive   Gastroesophageal reflux disease without esophagitis    Chronic, worsening with recent weight gain Request for refill of PPI      Relevant Medications   pantoprazole (PROTONIX) 40 MG tablet     Nervous and Auditory   Chronic left-sided low back pain with left-sided sciatica    Acute on chronic, associated with over use injury d[place] Exercises provided as well as refill of Mobic and new muscle relaxant Pt encouraged to reach back out if they need additional referral to ortho      Relevant Medications   meloxicam (MOBIC) 15 MG tablet   methocarbamol (ROBAXIN-750) 750 MG tablet     Other   Annual physical exam - Primary     Encounter for form Discussed annual/bi-annual vision and dental Things to do to keep yourself healthy  - Exercise at least 30-45 minutes a day, 3-4 days a week.  - Eat a low-fat diet with lots of fruits and vegetables, up to 7-9 servings per day.  - Seatbelts can save your life. Wear them always.  - Smoke detectors on every level of your home, check batteries every year.  - Eye Doctor - have an eye exam every 1-2 years  - Safe sex - if you may be exposed to STDs, use a condom.  - Alcohol -  If you drink, do it moderately, less than 2 drinks per day.  - [place] Choose someone to speak for you if you are not able.  - Depression is common in our stressful world.If you're feeling down or losing interest in things you normally enjoy, please come in for a visit.  - Violence - If anyone is threatening or hurting you, please call immediately.        Relevant Orders   Comprehensive metabolic panel   CBC with Differential/Platelet   TSH + free T4   Hemoglobin A1c   Lipid panel   Avitaminosis D    Chronic, unstable Request for repeat labs Previously at 14.5 4/23      Relevant Orders   Vitamin D (  25 hydroxy)   Family history of colon cancer    Due for 5 year f/u; last completed in 2017 Mom now with dx of colon cancer- starting treatment at [facility] Pt reports chronic, variable constipation        Relevant Orders   Ambulatory referral to Gastroenterology   History of colon polyps    Due for 5 year f/u; last completed in 2017 Mom now with dx of colon cancer- starting treatment at [facility] Pt reports chronic, variable constipation        Relevant Orders   Ambulatory referral to Gastroenterology   Hypercholesteremia    Chronic, unstable; last LDL of 144, goal <55 with development of DM, recommend <70 with pre-diabetes  Repeat FLP recommend diet low in saturated fat and regular exercise - 30 min at least 5 times per week       Relevant Orders   Comprehensive  metabolic panel   Lipid panel   Iron deficiency anemia    Chronic, stable Repeat labs Denies complaints of excess bleeding or bruising       Relevant Orders   CBC with Differential/Platelet   Iron, TIBC and Ferritin Panel   Morbidly obese (HCC)    Chronic, stable Has restarted training program at [employer] mass index is 45.16 kg/m. Discussed importance of healthy weight management Discussed diet and exercise       Relevant Orders   Comprehensive metabolic panel   CBC with Differential/Platelet   TSH + free T4   Hemoglobin A1c   Lipid panel   Prediabetes    Chronic, previously stable with diet/exercise 5.8% Repeat A1c Continue to recommend balanced, lower carb meals. Smaller meal size, adding snacks. Choosing water as drink of choice and increasing purposeful exercise.       Relevant Orders   Hemoglobin A1c   Screening for colon cancer    Due for 5 year f/u; last completed in 2017 Mom now with dx of colon cancer- starting treatment at [facility] Pt reports chronic, variable constipation       Relevant Orders   Ambulatory referral to Gastroenterology     Return in about 4 weeks (around 08/09/2022), or if symptoms worsen or fail to improve.     Vonna Kotyk, FNP, have reviewed all documentation for this visit. The documentation on 07/12/22 for the exam, diagnosis, procedures, and orders are all accurate and complete.    Gwyneth Sprout, [place] [phone] (phone) [phone] (fax)  [place]

## 2022-07-12 ENCOUNTER — Ambulatory Visit: Payer: 59 | Admitting: Family Medicine

## 2022-07-12 ENCOUNTER — Encounter: Payer: Self-pay | Admitting: Family Medicine

## 2022-07-12 VITALS — BP 134/77 | HR 63 | Temp 97.6°F | Resp 16 | Ht 61.0 in | Wt 239.0 lb

## 2022-07-12 DIAGNOSIS — R7303 Prediabetes: Secondary | ICD-10-CM | POA: Diagnosis not present

## 2022-07-12 DIAGNOSIS — I10 Essential (primary) hypertension: Secondary | ICD-10-CM | POA: Diagnosis not present

## 2022-07-12 DIAGNOSIS — K219 Gastro-esophageal reflux disease without esophagitis: Secondary | ICD-10-CM

## 2022-07-12 DIAGNOSIS — G8929 Other chronic pain: Secondary | ICD-10-CM

## 2022-07-12 DIAGNOSIS — Z0000 Encounter for general adult medical examination without abnormal findings: Secondary | ICD-10-CM

## 2022-07-12 DIAGNOSIS — D509 Iron deficiency anemia, unspecified: Secondary | ICD-10-CM

## 2022-07-12 DIAGNOSIS — M5442 Lumbago with sciatica, left side: Secondary | ICD-10-CM

## 2022-07-12 DIAGNOSIS — Z1211 Encounter for screening for malignant neoplasm of colon: Secondary | ICD-10-CM

## 2022-07-12 DIAGNOSIS — Z860100 Personal history of colon polyps, unspecified: Secondary | ICD-10-CM

## 2022-07-12 DIAGNOSIS — E559 Vitamin D deficiency, unspecified: Secondary | ICD-10-CM

## 2022-07-12 DIAGNOSIS — Z800 Family history of malignant neoplasm of digestive organs: Secondary | ICD-10-CM

## 2022-07-12 DIAGNOSIS — E7800 Pure hypercholesterolemia, unspecified: Secondary | ICD-10-CM | POA: Diagnosis not present

## 2022-07-12 MED ORDER — PANTOPRAZOLE SODIUM 40 MG PO TBEC: 40.0000 mg | DELAYED_RELEASE_TABLET | Freq: Every day | ORAL | 3 refills | Status: DC

## 2022-07-12 MED ORDER — METHOCARBAMOL 750 MG PO TABS: 750.0000 mg | ORAL_TABLET | Freq: Two times a day (BID) | ORAL | 0 refills | Status: DC

## 2022-07-12 MED ORDER — MELOXICAM 15 MG PO TABS: 15.0000 mg | ORAL_TABLET | Freq: Every day | ORAL | 0 refills | Status: DC

## 2022-07-12 NOTE — Assessment & Plan Note (Signed)
Due for 5 year f/u; last completed in 2017 Mom now with dx of colon cancer- starting treatment at [facility] Pt reports chronic, variable constipation

## 2022-07-12 NOTE — Assessment & Plan Note (Signed)
Chronic, borderline with pre-diabetes Goal <130/<80 Repeat CMP Continue to address diet/exercise to assist

## 2022-07-12 NOTE — Assessment & Plan Note (Signed)
Encounter for form Discussed annual/bi-annual vision and dental Things to do to keep yourself healthy  - Exercise at least 30-45 minutes a day, 3-4 days a week.  - Eat a low-fat diet with lots of fruits and vegetables, up to 7-9 servings per day.  - Seatbelts can save your life. Wear them always.  - Smoke detectors on every level of your home, check batteries every year.  - Eye Doctor - have an eye exam every 1-2 years  - Safe sex - if you may be exposed to STDs, use a condom.  - Alcohol -  If you drink, do it moderately, less than 2 drinks per day.  - [place]. Choose someone to speak for you if you are not able.  - Depression is common in our stressful world.If you're feeling down or losing interest in things you normally enjoy, please come in for a visit.  - Violence - If anyone is threatening or hurting you, please call immediately.

## 2022-07-12 NOTE — Assessment & Plan Note (Signed)
Due for 5 year f/u; last completed in 2017 Mom now with dx of colon cancer- starting treatment at [facility] Pt reports chronic, variable constipation

## 2022-07-12 NOTE — Assessment & Plan Note (Signed)
Chronic, worsening with recent weight gain Request for refill of PPI

## 2022-07-12 NOTE — Assessment & Plan Note (Signed)
Acute on chronic, associated with over use injury d/t standing on hard floors at [place], second job Exercises provided as well as refill of Mobic and new muscle relaxant Pt encouraged to reach back out if they need additional referral to ortho

## 2022-07-12 NOTE — Assessment & Plan Note (Signed)
Chronic, unstable; last LDL of 144, goal <55 with development of DM, recommend <70 with pre-diabetes  Repeat FLP recommend diet low in saturated fat and regular exercise - 30 min at least 5 times per week

## 2022-07-12 NOTE — Assessment & Plan Note (Signed)
Chronic, stable Has restarted training program at [employer] mass index is 45.16 kg/m. Discussed importance of healthy weight management Discussed diet and exercise

## 2022-07-12 NOTE — Assessment & Plan Note (Signed)
Chronic, unstable Request for repeat labs Previously at 14.5 4/23

## 2022-07-12 NOTE — Assessment & Plan Note (Signed)
Chronic, previously stable with diet/exercise 5.8% Repeat A1c Continue to recommend balanced, lower carb meals. Smaller meal size, adding snacks. Choosing water as drink of choice and increasing purposeful exercise.

## 2022-07-12 NOTE — Assessment & Plan Note (Signed)
Chronic, stable Repeat labs Denies complaints of excess bleeding or bruising

## 2022-07-12 NOTE — Assessment & Plan Note (Signed)
Due for 5 year f/u; last completed in 2017 Mom now with dx of colon cancer- starting treatment at [facility] Pt reports chronic, variable constipation

## 2022-07-13 LAB — COMPREHENSIVE METABOLIC PANEL
ALT: 23 IU/L (ref 0–32)
AST: 18 IU/L (ref 0–40)
Albumin/Globulin Ratio: 1.6 (ref 1.2–2.2)
Albumin: 4.4 g/dL (ref 3.9–4.9)
Alkaline Phosphatase: 65 IU/L (ref 44–121)
BUN/Creatinine Ratio: 15 (ref 9–23)
BUN: 11 mg/dL (ref 6–24)
Bilirubin Total: 0.5 mg/dL (ref 0.0–1.2)
CO2: 23 mmol/L (ref 20–29)
Calcium: 10.1 mg/dL (ref 8.7–10.2)
Chloride: 105 mmol/L (ref 96–106)
Creatinine, Ser: 0.71 mg/dL (ref 0.57–1.00)
Globulin, Total: 2.7 g/dL (ref 1.5–4.5)
Glucose: 90 mg/dL (ref 70–99)
Potassium: 4.3 mmol/L (ref 3.5–5.2)
Sodium: 143 mmol/L (ref 134–144)
Total Protein: 7.1 g/dL (ref 6.0–8.5)
eGFR: 104 mL/min/{1.73_m2} (ref >59)

## 2022-07-13 LAB — CBC WITH DIFFERENTIAL/PLATELET
Basophils Absolute: 0 10*3/uL (ref 0.0–0.2)
Basos: 1 %
EOS (ABSOLUTE): 0.1 10*3/uL (ref 0.0–0.4)
Eos: 4 %
Hematocrit: 39.5 % (ref 34.0–46.6)
Hemoglobin: 13.4 g/dL (ref 11.1–15.9)
Immature Grans (Abs): 0 10*3/uL (ref 0.0–0.1)
Immature Granulocytes: 0 %
Lymphocytes Absolute: 1.5 10*3/uL (ref 0.7–3.1)
Lymphs: 37 %
MCH: 28 pg (ref 26.6–33.0)
MCHC: 33.9 g/dL (ref 31.5–35.7)
MCV: 83 fL (ref 79–97)
Monocytes Absolute: 0.4 10*3/uL (ref 0.1–0.9)
Monocytes: 9 %
Neutrophils Absolute: 2 10*3/uL (ref 1.4–7.0)
Neutrophils: 49 %
Platelets: 291 10*3/uL (ref 150–450)
RBC: 4.79 x10E6/uL (ref 3.77–5.28)
RDW: 13.1 % (ref 11.7–15.4)
WBC: 4 10*3/uL (ref 3.4–10.8)

## 2022-07-13 LAB — IRON,TIBC AND FERRITIN PANEL
Ferritin: 60 ng/mL (ref 15–150)
Iron Saturation: 18 % (ref 15–55)
Iron: 67 ug/dL (ref 27–159)
Total Iron Binding Capacity: 371 ug/dL (ref 250–450)
UIBC: 304 ug/dL (ref 131–425)

## 2022-07-13 LAB — LIPID PANEL
Chol/HDL Ratio: 3 ratio (ref 0.0–4.4)
Cholesterol, Total: 207 mg/dL — ABNORMAL HIGH (ref 100–199)
HDL: 70 mg/dL (ref >39)
LDL Chol Calc (NIH): 127 mg/dL — ABNORMAL HIGH (ref 0–99)
Triglycerides: 56 mg/dL (ref 0–149)
VLDL Cholesterol Cal: 10 mg/dL (ref 5–40)

## 2022-07-13 LAB — TSH+FREE T4
Free T4: 1.04 ng/dL (ref 0.82–1.77)
TSH: 0.589 u[IU]/mL (ref 0.450–4.500)

## 2022-07-13 LAB — VITAMIN D 25 HYDROXY (VIT D DEFICIENCY, FRACTURES): Vit D, 25-Hydroxy: 35.1 ng/mL (ref 30.0–100.0)

## 2022-07-13 LAB — HEMOGLOBIN A1C
Est. average glucose Bld gHb Est-mCnc: 120 mg/dL
Hgb A1c MFr Bld: 5.8 % — ABNORMAL HIGH (ref 4.8–5.6)

## 2022-07-15 NOTE — Progress Notes (Signed)
Stable pre-diabetes at 5.8%. Continue to recommend balanced, lower carb meals. Smaller meal size, adding snacks. Choosing water as drink of choice and increasing purposeful exercise.  Cholesterol is slightly improved; LDL is 127 with total of 207. The 10-year ASCVD risk score (Arnett DK, et al., 2019) is: 1.7%   Values used to calculate the score:     Age: 51 years     Sex: Female     Is Non-Hispanic African American: Yes     Diabetic: No     Tobacco smoker: No     Systolic Blood Pressure: 153 mmHg     Is BP treated: No     HDL Cholesterol: 70 mg/dL     Total Cholesterol: 207 mg/dL Heart attack and stroke risk is 2% estimated within the next 10 years which is low.  I recommend diet low in saturated fat and regular exercise - 30 min at least 5 times per week  All other labs are stable and improved; including Vit D and iron saturation.  Gwyneth Sprout, [facility] [address] [place], [place] [address] [phone] (phone) [phone] (fax) [place]

## 2022-07-16 ENCOUNTER — Other Ambulatory Visit: Payer: Self-pay

## 2022-07-16 ENCOUNTER — Telehealth: Payer: Self-pay

## 2022-07-16 DIAGNOSIS — Z800 Family history of malignant neoplasm of digestive organs: Secondary | ICD-10-CM

## 2022-07-16 MED ORDER — NA SULFATE-K SULFATE-MG SULF 17.5-3.13-1.6 GM/177ML PO SOLN
1.0000 | Freq: Once | ORAL | 0 refills | Status: AC
Start: 2022-07-16 — End: 2022-07-16

## 2022-07-16 NOTE — Telephone Encounter (Signed)
Gastroenterology Pre-Procedure Review  Request Date: 09/27/22 Requesting Physician: Dr. Vicente Males  PATIENT REVIEW QUESTIONS: The patient responded to the following health history questions as indicated:    1. Are you having any GI issues? no 2. Do you have a personal history of Polyps? No patients last colonoscopy 10/18/16 recommended repeat in 5 years  3. Do you have a family history of Colon Cancer or Polyps? yes (mother recently diagnosed with colon cancer) 4. Diabetes Mellitus? no 5. Joint replacements in the past 12 months?no 6. Major health problems in the past 3 months?no 7. Any artificial heart valves, MVP, or defibrillator?no    MEDICATIONS & ALLERGIES:    Patient reports the following regarding taking any anticoagulation/antiplatelet therapy:   Plavix, Coumadin, Eliquis, Xarelto, Lovenox, Pradaxa, Brilinta, or Effient? no Aspirin? no  Patient confirms/reports the following medications:  Current Outpatient Medications  Medication Sig Dispense Refill   Cetirizine HCl 10 MG CAPS Take 10 mg by mouth daily as needed.     Cholecalciferol (VITAMIN D-3) 25 MCG (1000 UT) CAPS Take by mouth.     ferrous sulfate 325 (65 FE) MG EC tablet Take 325 mg by mouth 3 (three) times daily with meals.     gabapentin (NEURONTIN) 300 MG capsule Take 1 capsule (300 mg total) by mouth at bedtime. 30 capsule 2   meloxicam (MOBIC) 15 MG tablet Take 1 tablet (15 mg total) by mouth daily. 30 tablet 0   methocarbamol (ROBAXIN-750) 750 MG tablet Take 1 tablet (750 mg total) by mouth in the morning and at bedtime. 60 tablet 0   pantoprazole (PROTONIX) 40 MG tablet Take 1 tablet (40 mg total) by mouth daily. 90 tablet 3   scopolamine (TRANSDERM-SCOP) 1 MG/3DAYS Place 1 patch (1.5 mg total) onto the skin every 3 (three) days. 10 patch 12   valACYclovir (VALTREX) 500 MG tablet Take 1 tablet (500 mg total) by mouth daily. 90 tablet 3   No current facility-administered medications for this visit.    Patient  confirms/reports the following allergies:  No Known Allergies  No orders of the defined types were placed in this encounter.   AUTHORIZATION INFORMATION Primary Insurance: 1D#: Group #:  Secondary Insurance: 1D#: Group #:  SCHEDULE INFORMATION: Date: 10/27/223 Time: Location: [facility]

## 2022-09-19 ENCOUNTER — Other Ambulatory Visit: Payer: Self-pay

## 2022-09-19 ENCOUNTER — Telehealth: Payer: Self-pay | Admitting: Gastroenterology

## 2022-09-19 MED ORDER — NA SULFATE-K SULFATE-MG SULF 17.5-3.13-1.6 GM/177ML PO SOLN
1.0000 | Freq: Once | ORAL | 0 refills | Status: AC
Start: 2022-09-19 — End: 2022-09-19

## 2022-09-19 NOTE — Telephone Encounter (Signed)
Patient call returned.  [facility] stated that [facility] no longer had rx in system.  [facility] [address]  Thanks, Sharyn Lull CMA

## 2022-09-19 NOTE — Telephone Encounter (Signed)
Patient calling saying that her prescription for bowel prep is not at her pharmacy. Patient requests you send it in and give her a call back. Thanks

## 2022-09-27 ENCOUNTER — Encounter: Payer: Self-pay | Admitting: Gastroenterology

## 2022-09-27 ENCOUNTER — Ambulatory Visit: Payer: 59 | Admitting: Certified Registered Nurse Anesthetist

## 2022-09-27 ENCOUNTER — Ambulatory Visit
Admission: RE | Admit: 2022-09-27 | Discharge: 2022-09-27 | Disposition: A | Discharge status: Home / Self Care | Payer: 59 | Attending: Gastroenterology | Admitting: Gastroenterology

## 2022-09-27 ENCOUNTER — Other Ambulatory Visit: Payer: Self-pay

## 2022-09-27 ENCOUNTER — Telehealth: Payer: Self-pay | Admitting: *Deleted

## 2022-09-27 ENCOUNTER — Other Ambulatory Visit: Payer: Self-pay | Admitting: *Deleted

## 2022-09-27 ENCOUNTER — Encounter
Admission: RE | Disposition: A | Discharge status: Home / Self Care | Payer: Self-pay | Source: Home / Self Care | Attending: Gastroenterology

## 2022-09-27 DIAGNOSIS — Z800 Family history of malignant neoplasm of digestive organs: Secondary | ICD-10-CM

## 2022-09-27 DIAGNOSIS — K219 Gastro-esophageal reflux disease without esophagitis: Secondary | ICD-10-CM | POA: Insufficient documentation

## 2022-09-27 DIAGNOSIS — Z539 Procedure and treatment not carried out, unspecified reason: Secondary | ICD-10-CM | POA: Insufficient documentation

## 2022-09-27 DIAGNOSIS — Z1211 Encounter for screening for malignant neoplasm of colon: Secondary | ICD-10-CM | POA: Diagnosis not present

## 2022-09-27 SURGERY — COLONOSCOPY WITH PROPOFOL
Anesthesia: General

## 2022-09-27 MED ORDER — GLYCOPYRROLATE 0.2 MG/ML IJ SOLN
INTRAMUSCULAR | Status: AC
  Filled 2022-09-27: qty 1

## 2022-09-27 MED ORDER — SUTAB 1479-225-188 MG PO TABS: ORAL_TABLET | ORAL | 0 refills | Status: DC

## 2022-09-27 MED ORDER — SODIUM CHLORIDE 0.9 % IV SOLN: INTRAVENOUS | Status: DC

## 2022-09-27 MED ORDER — LIDOCAINE HCL (PF) 2 % IJ SOLN
INTRAMUSCULAR | Status: AC
  Filled 2022-09-27: qty 5

## 2022-09-27 MED ORDER — PROPOFOL 1000 MG/100ML IV EMUL
INTRAVENOUS | Status: AC
  Filled 2022-09-27: qty 400

## 2022-09-27 NOTE — Telephone Encounter (Signed)
Per Dr Vicente Males, patient threw up half the prep. Her colonoscopy was this morning 09/27/2022. Reschedule- please call her - she would like to try a different prep please.  Spoken to patient and I was able to have her schedule for 11/01/2022.  New instructions will be sent. Patient verbalized understanding.

## 2022-09-27 NOTE — OR Nursing (Signed)
Reports unable to tolerate prep. Threw up at least 1/2 of it. Dr. Vicente Males in to speak with patient and procedure cancelled due to inadequate prep.

## 2022-09-27 NOTE — Anesthesia Preprocedure Evaluation (Addendum)
Anesthesia Evaluation  Patient identified by MRN, date of birth, ID band Patient awake    Reviewed: Allergy & Precautions, NPO status , Patient's Chart, lab work & pertinent test results  History of Anesthesia Complications (+) history of anesthetic complications  Airway Mallampati: III  TM Distance: >3 FB Neck ROM: full    Dental  (+) Chipped   Pulmonary neg pulmonary ROS, neg shortness of breath,    Pulmonary exam normal        Cardiovascular Exercise Tolerance: Good hypertension, (-) anginaNormal cardiovascular exam     Neuro/Psych PSYCHIATRIC DISORDERS  Neuromuscular disease    GI/Hepatic Neg liver ROS, GERD  Controlled,  Endo/Other  negative endocrine ROS  Renal/GU negative Renal ROS  negative genitourinary   Musculoskeletal   Abdominal   Peds  Hematology negative hematology ROS (+)   Anesthesia Other Findings Past Medical History: No date: Anemia No date: Anxiety No date: GERD (gastroesophageal reflux disease) No date: Hyperlipidemia No date: Pre-diabetes  Past Surgical History: No date: CESAREAN SECTION     Comment:  x3 10/18/2016: COLONOSCOPY WITH PROPOFOL; N/A     Comment:  Procedure: COLONOSCOPY WITH PROPOFOL;  Surgeon: Jonathon Bellows, MD;  Location: [facility] ENDOSCOPY;  Service: Endoscopy;              Laterality: N/A; No date: Endometriotic cyst     Comment:  Dr. Jamal Collin 1999: GALLBLADDER SURGERY     Comment:  Dr. Kirtland Bouchard 1998: Post cholcystectomy No date: TUBAL LIGATION; Bilateral     Reproductive/Obstetrics negative OB ROS                            Anesthesia Physical Anesthesia Plan  ASA: 3  Anesthesia Plan: General   Post-op Pain Management:    Induction: Intravenous  PONV Risk Score and Plan: Propofol infusion and TIVA  Airway Management Planned: Natural Airway and Nasal Cannula  Additional Equipment:   Intra-op Plan:   Post-operative  Plan:   Informed Consent: I have reviewed the patients History and Physical, chart, labs and discussed the procedure including the risks, benefits and alternatives for the proposed anesthesia with the patient or authorized representative who has indicated his/her understanding and acceptance.     Dental Advisory Given  Plan Discussed with: Anesthesiologist, CRNA and Surgeon  Anesthesia Plan Comments: (Patient reports that she threw up during a gyn procedure over 10 years ago.  Unable to view these records due to age of the case.  Patient had colonoscopy with natural airway and propofol with no problems in 2017.  Patient asymptomatic for GERD now.  Patient given option for intubation and declines intubation opting for natural airway as she had last time and assents to the risks.  Patient consented for risks of anesthesia including but not limited to:  - adverse reactions to medications - risk of airway placement if required - damage to eyes, teeth, lips or other oral mucosa - nerve damage due to positioning  - sore throat or hoarseness - Damage to heart, brain, nerves, lungs, other parts of body or loss of life  Patient voiced understanding.)        Anesthesia Quick Evaluation

## 2022-09-30 NOTE — Addendum Note (Signed)
Addendum  created 09/30/22 1133 by Aline Brochure, CRNA   Intraprocedure Meds edited

## 2022-10-31 ENCOUNTER — Encounter: Payer: Self-pay | Admitting: Gastroenterology

## 2022-11-01 ENCOUNTER — Ambulatory Visit
Admission: RE | Admit: 2022-11-01 | Discharge: 2022-11-01 | Disposition: A | Discharge status: Home / Self Care | Payer: 59 | Attending: Gastroenterology | Admitting: Gastroenterology

## 2022-11-01 ENCOUNTER — Encounter: Payer: Self-pay | Admitting: Gastroenterology

## 2022-11-01 ENCOUNTER — Ambulatory Visit: Payer: 59 | Admitting: Anesthesiology

## 2022-11-01 ENCOUNTER — Encounter
Admission: RE | Disposition: A | Discharge status: Home / Self Care | Payer: Self-pay | Source: Home / Self Care | Attending: Gastroenterology

## 2022-11-01 ENCOUNTER — Other Ambulatory Visit: Payer: Self-pay

## 2022-11-01 DIAGNOSIS — Z800 Family history of malignant neoplasm of digestive organs: Secondary | ICD-10-CM | POA: Diagnosis not present

## 2022-11-01 DIAGNOSIS — D126 Benign neoplasm of colon, unspecified: Secondary | ICD-10-CM

## 2022-11-01 DIAGNOSIS — K219 Gastro-esophageal reflux disease without esophagitis: Secondary | ICD-10-CM | POA: Diagnosis not present

## 2022-11-01 DIAGNOSIS — I10 Essential (primary) hypertension: Secondary | ICD-10-CM | POA: Insufficient documentation

## 2022-11-01 DIAGNOSIS — K6389 Other specified diseases of intestine: Secondary | ICD-10-CM | POA: Insufficient documentation

## 2022-11-01 DIAGNOSIS — Z1211 Encounter for screening for malignant neoplasm of colon: Secondary | ICD-10-CM | POA: Diagnosis not present

## 2022-11-01 DIAGNOSIS — F419 Anxiety disorder, unspecified: Secondary | ICD-10-CM | POA: Diagnosis not present

## 2022-11-01 HISTORY — PX: COLONOSCOPY WITH PROPOFOL: SHX5780

## 2022-11-01 SURGERY — COLONOSCOPY WITH PROPOFOL
Anesthesia: General

## 2022-11-01 MED ORDER — PROPOFOL 10 MG/ML IV BOLUS
INTRAVENOUS | Status: DC | PRN
  Administered 2022-11-01: 100 mg via INTRAVENOUS

## 2022-11-01 MED ORDER — GLYCOPYRROLATE 0.2 MG/ML IJ SOLN
INTRAMUSCULAR | Status: DC | PRN
  Administered 2022-11-01: .2 mg via INTRAVENOUS

## 2022-11-01 MED ORDER — DEXMEDETOMIDINE HCL IN NACL 80 MCG/20ML IV SOLN
INTRAVENOUS | Status: DC | PRN
  Administered 2022-11-01: 12 ug via BUCCAL

## 2022-11-01 MED ORDER — SODIUM CHLORIDE 0.9 % IV SOLN: INTRAVENOUS | Status: DC

## 2022-11-01 MED ORDER — PROPOFOL 500 MG/50ML IV EMUL
INTRAVENOUS | Status: DC | PRN
  Administered 2022-11-01: 140 ug/kg/min via INTRAVENOUS

## 2022-11-01 MED ORDER — STERILE WATER FOR IRRIGATION IR SOLN
Status: DC | PRN
  Administered 2022-11-01: 60 mL

## 2022-11-01 NOTE — Anesthesia Postprocedure Evaluation (Signed)
Anesthesia Post Note  Patient: Sarah Monroe  Procedure(s) Performed: COLONOSCOPY WITH PROPOFOL  Patient location during evaluation: Endoscopy Anesthesia Type: General Level of consciousness: awake and alert Pain management: pain level controlled Vital Signs Assessment: post-procedure vital signs reviewed and stable Respiratory status: spontaneous breathing, nonlabored ventilation, respiratory function stable and patient connected to nasal cannula oxygen Cardiovascular status: blood pressure returned to baseline and stable Postop Assessment: no apparent nausea or vomiting Anesthetic complications: no   No notable events documented.   Last Vitals:  Vitals:   11/01/22 0839 11/01/22 0917  BP: (!) 167/96 117/75  Pulse: 72   Resp: 18   Temp: (!) 35.7 C (!) 35.8 C  SpO2: 100%     Last Pain:  Vitals:   11/01/22 0927  TempSrc:   PainSc: 0-No pain                 Precious Haws Odesser Tourangeau

## 2022-11-01 NOTE — Anesthesia Preprocedure Evaluation (Signed)
Anesthesia Evaluation  Patient identified by MRN, date of birth, ID band Patient awake    Reviewed: Allergy & Precautions, NPO status , Patient's Chart, lab work & pertinent test results  History of Anesthesia Complications (+) history of anesthetic complications  Airway Mallampati: III  TM Distance: >3 FB Neck ROM: full    Dental  (+) Chipped   Pulmonary neg pulmonary ROS, neg shortness of breath   Pulmonary exam normal        Cardiovascular Exercise Tolerance: Good hypertension, (-) angina Normal cardiovascular exam     Neuro/Psych  PSYCHIATRIC DISORDERS       Neuromuscular disease    GI/Hepatic Neg liver ROS,GERD  Controlled,,  Endo/Other  negative endocrine ROS    Renal/GU negative Renal ROS  negative genitourinary   Musculoskeletal   Abdominal   Peds  Hematology negative hematology ROS (+)   Anesthesia Other Findings Past Medical History: No date: Anemia No date: Anxiety No date: GERD (gastroesophageal reflux disease) No date: Hyperlipidemia No date: Pre-diabetes  Past Surgical History: No date: CESAREAN SECTION     Comment:  x3 10/18/2016: COLONOSCOPY WITH PROPOFOL; N/A     Comment:  Procedure: COLONOSCOPY WITH PROPOFOL;  Surgeon: Jonathon Bellows, MD;  Location: [facility] ENDOSCOPY;  Service: Endoscopy;              Laterality: N/A; No date: Endometriotic cyst     Comment:  Dr. Jamal Collin 1999: GALLBLADDER SURGERY     Comment:  Dr. Kirtland Bouchard 1998: Post cholcystectomy No date: TUBAL LIGATION; Bilateral     Reproductive/Obstetrics negative OB ROS                              Anesthesia Physical Anesthesia Plan  ASA: 3  Anesthesia Plan: General   Post-op Pain Management:    Induction: Intravenous  PONV Risk Score and Plan: Propofol infusion and TIVA  Airway Management Planned: Natural Airway and Nasal Cannula  Additional Equipment:   Intra-op Plan:    Post-operative Plan:   Informed Consent: I have reviewed the patients History and Physical, chart, labs and discussed the procedure including the risks, benefits and alternatives for the proposed anesthesia with the patient or authorized representative who has indicated his/her understanding and acceptance.     Dental Advisory Given  Plan Discussed with: Anesthesiologist, CRNA and Surgeon  Anesthesia Plan Comments: (Patient reports that she threw up during a gyn procedure about 20 years ago.  Unable to view these records due to age of the case.  Patient had colonoscopy with natural airway and propofol with no problems in 2017.  Patient asymptomatic for GERD now.  Patient given option for intubation and declines intubation opting for natural airway as she had last time without complication. She assents to the risks.  Patient consented for risks of anesthesia including but not limited to:  - adverse reactions to medications - risk of airway placement if required - damage to eyes, teeth, lips or other oral mucosa - nerve damage due to positioning  - sore throat or hoarseness - Damage to heart, brain, nerves, lungs, other parts of body or loss of life  Patient voiced understanding.)         Anesthesia Quick Evaluation

## 2022-11-01 NOTE — H&P (Signed)
Sarah Bellows, MD [address] [place], [place], [place], [address] [address], [place], [place], [place], [address] Phone: [phone]  Fax: [phone]  Primary Care Physician:  Gwyneth Sprout, FNP   Pre-Procedure History & Physical: HPI:  Sarah Monroe is a 51 y.o. female is here for an colonoscopy.   Past Medical History:  Diagnosis Date   Anemia    Anxiety    GERD (gastroesophageal reflux disease)    Hyperlipidemia    Pre-diabetes     Past Surgical History:  Procedure Laterality Date   CESAREAN SECTION     x3   COLONOSCOPY WITH PROPOFOL N/A 10/18/2016   Procedure: COLONOSCOPY WITH PROPOFOL;  Surgeon: Sarah Bellows, MD;  Location: [facility] ENDOSCOPY;  Service: Endoscopy;  Laterality: N/A;   Endometriotic cyst     Dr. Jamal Collin   GALLBLADDER SURGERY  1999   Dr. Kirtland Bouchard   Post cholcystectomy  1998   TUBAL LIGATION Bilateral     Prior to Admission medications   Medication Sig Start Date End Date Taking? Authorizing Provider  Cetirizine HCl 10 MG CAPS Take 10 mg by mouth daily as needed. 01/31/11  Yes [provider]  Cholecalciferol (VITAMIN D-3) 25 MCG (1000 UT) CAPS Take by mouth.   Yes [provider]  ferrous sulfate 325 (65 FE) MG EC tablet Take 325 mg by mouth 3 (three) times daily with meals.   Yes [provider]  gabapentin (NEURONTIN) 300 MG capsule Take 1 capsule (300 mg total) by mouth at bedtime. 07/23/21  Yes Bacigalupo, Dionne Bucy, MD  meloxicam (MOBIC) 15 MG tablet Take 1 tablet (15 mg total) by mouth daily. 07/12/22  Yes Gwyneth Sprout, FNP  methocarbamol (ROBAXIN-750) 750 MG tablet Take 1 tablet (750 mg total) by mouth in the morning and at bedtime. 07/12/22  Yes Gwyneth Sprout, FNP  pantoprazole (PROTONIX) 40 MG tablet Take 1 tablet (40 mg total) by mouth daily. 07/12/22  Yes Gwyneth Sprout, FNP  valACYclovir (VALTREX) 500 MG tablet Take 1 tablet (500 mg total) by mouth daily. 04/17/22  Yes Gwyneth Sprout, FNP  scopolamine  (TRANSDERM-SCOP) 1 MG/3DAYS Place 1 patch (1.5 mg total) onto the skin every 3 (three) days. 03/18/22   Gwyneth Sprout, FNP  Sodium Sulfate-Mag Sulfate-KCl (SUTAB) [phone] MG TABS Use as instructed once Patient not taking: Reported on 11/01/2022 09/27/22   Sarah Bellows, MD    Allergies as of 09/27/2022   (No Known Allergies)    Family History  Problem Relation Age of Onset   Allergies Mother    Diabetes Mother        History of DM   Hyperlipidemia Mother    Hypertension Mother        But is well controlled due to diet/exercise   Colon polyps Mother    Hypertension Father    Diabetes Father        Type 2   Hernia Father    Heart murmur Father    Dementia Father    Colon cancer Cousin 73   Hypertension Brother    Breast cancer Neg Hx     Social History   Socioeconomic History   Marital status: Single    Spouse name: Not on file   Number of children: Not on file   Years of education: Not on file   Highest education level: Not on file  Occupational History   Not on file  Tobacco Use   Smoking status:  Never   Smokeless tobacco: Never  Vaping Use   Vaping Use: Never used  Substance and Sexual Activity   Alcohol use: No    Comment: occassional   Drug use: No   Sexual activity: Yes    Birth control/protection: None, Surgical    Comment: Tubal ligation; monogomous relationship with female  Other Topics Concern   Not on file  Social History Narrative   Works remote with [employer].     Social Determinants of Health   Financial Resource Strain: Not on file  Food Insecurity: Not on file  Transportation Needs: Not on file  Physical Activity: Not on file  Stress: Not on file  Social Connections: Not on file  Intimate Partner Violence: Not on file    Review of Systems: See HPI, otherwise negative ROS  Physical Exam: BP (!) 167/96   Pulse 72   Temp (!) 96.2 F (35.7 C) (Temporal)   Resp 18   Ht '5\' 1"'$  (1.549 m)   Wt 113.3 kg   SpO2 100%   BMI 47.20 kg/m   General:   Alert,  pleasant and cooperative in NAD Head:  Normocephalic and atraumatic. Neck:  Supple; no masses or thyromegaly. Lungs:  Clear throughout to auscultation, normal respiratory effort.    Heart:  +S1, +S2, Regular rate and rhythm, No edema. Abdomen:  Soft, nontender and nondistended. Normal bowel sounds, without guarding, and without rebound.   Neurologic:  Alert and  oriented x4;  grossly normal neurologically.  Impression/Plan: [name] is here for an colonoscopy to be performed for Screening colonoscopy mother has colon cancer  Risks, benefits, limitations, and alternatives regarding  colonoscopy have been reviewed with the patient.  Questions have been answered.  All parties agreeable.   Sarah Bellows, MD  11/01/2022, 8:50 AM

## 2022-11-01 NOTE — Op Note (Signed)
[facility] Gastroenterology Patient Name: Sarah Monroe Procedure Date: 11/01/2022 8:51 AM MRN: [id number] Account #: [id number] Date of Birth: 1971/06/19 Admit Type: Outpatient Age: 51 Room: [facility] ENDO ROOM 4 Gender: Female Note Status: Finalized Instrument Name: Park Meo [id number] Procedure:             Colonoscopy Indications:           Screening in patient at increased risk: Family history                         of 1st-degree relative with colorectal cancer Providers:             Jonathon Bellows MD, MD Referring MD:          Sarah Monroe. Sarah Monroe (Referring MD) Medicines:             Monitored Anesthesia Care Complications:         No immediate complications. Procedure:             Pre-Anesthesia Assessment:                        - Prior to the procedure, a History and Physical was                         performed, and patient medications, allergies and                         sensitivities were reviewed. The patient's tolerance                         of previous anesthesia was reviewed.                        - The risks and benefits of the procedure and the                         sedation options and risks were discussed with the                         patient. All questions were answered and informed                         consent was obtained.                        - ASA Grade Assessment: II - A patient with mild                         systemic disease.                        After obtaining informed consent, the colonoscope was                         passed under direct vision. Throughout the procedure,                         the patient's blood pressure, pulse, and oxygen  saturations were monitored continuously. The                         Colonoscope was introduced through the anus and                         advanced to the the cecum, identified by the                         appendiceal orifice. The colonoscopy was performed                          with ease. The patient tolerated the procedure well.                         The quality of the bowel preparation was excellent.                         The appendiceal orifice was photographed. Findings:      The perianal and digital rectal examinations were normal.      The mucosa vascular pattern at the ileocecal valve was locally       increased. This was biopsied with a cold forceps for histology.      The exam was otherwise without abnormality on direct and retroflexion       views. Impression:            - Increased mucosa vascular pattern at the ileocecal                         valve. Biopsied.                        - The examination was otherwise normal on direct and                         retroflexion views. Recommendation:        - Discharge patient to home (with escort).                        - Resume previous diet.                        - Continue present medications.                        - Await pathology results.                        - Repeat colonoscopy for surveillance based on                         pathology results. Procedure Code(s):     --- Professional ---                        [phone], Colonoscopy, flexible; with biopsy, single or                         multiple Diagnosis Code(s):     --- Professional ---  Z80.0, Family history of malignant neoplasm of                         digestive organs CPT copyright 2022 American Medical Association. All rights reserved. The codes documented in this report are preliminary and upon coder review may  be revised to meet current compliance requirements. Jonathon Bellows, MD Jonathon Bellows MD, MD 11/01/2022 9:16:47 AM This report has been signed electronically. Number of Addenda: 0 Note Initiated On: 11/01/2022 8:51 AM Scope Withdrawal Time: 0 hours 12 minutes 15 seconds  Total Procedure Duration: 0 hours 15 minutes 41 seconds  Estimated Blood Loss:  Estimated blood loss:  none.      [facility]

## 2022-11-01 NOTE — Transfer of Care (Signed)
Immediate Anesthesia Transfer of Care Note  Patient: Sarah Monroe  Procedure(s) Performed: COLONOSCOPY WITH PROPOFOL  Patient Location: Endoscopy Unit  Anesthesia Type:General  Level of Consciousness: drowsy  Airway & Oxygen Therapy: Patient Spontanous Breathing  Post-op Assessment: Report given to RN  Post vital signs: stable  Last Vitals:  Vitals Value Taken Time  BP    Temp    Pulse    Resp    SpO2      Last Pain:  Vitals:   11/01/22 0839  TempSrc: Temporal  PainSc: 0-No pain         Complications: No notable events documented.

## 2022-11-04 ENCOUNTER — Encounter: Payer: Self-pay | Admitting: Gastroenterology

## 2022-11-04 LAB — SURGICAL PATHOLOGY

## 2023-04-19 ENCOUNTER — Other Ambulatory Visit: Payer: Self-pay | Admitting: Family Medicine

## 2023-04-19 DIAGNOSIS — B009 Herpesviral infection, unspecified: Secondary | ICD-10-CM

## 2023-04-21 NOTE — Telephone Encounter (Signed)
Requested medication (s) are due for refill today - expired Rx  Requested medication (s) are on the active medication list -yes  Future visit scheduled -no  Last refill: 04/17/22 #90 3RF  Notes to clinic: expired Rx  Requested Prescriptions  Pending Prescriptions Disp Refills   valACYclovir (VALTREX) 500 MG tablet [Pharmacy Med Name: VALACYCLOVIR HCL 500 MG TABLET] 90 tablet 3    Sig: Take 1 tablet (500 mg total) by mouth daily.     Antimicrobials:  Antiviral Agents - Anti-Herpetic Passed - 04/19/2023  1:15 AM      Passed - Valid encounter within last 12 months    Recent Outpatient Visits           9 months ago Annual physical exam   [facility] Jacky Kindle, FNP   1 year ago Neuropathy   [facility] Jacky Kindle, FNP   1 year ago Dysuria   [facility] Caro Laroche, DO   1 year ago Encounter for screening mammogram for malignant neoplasm of breast   [facility] Merita Norton T, FNP   1 year ago Elevated BP without diagnosis of hypertension   [facility], Marzella Schlein, MD                 Requested Prescriptions  Pending Prescriptions Disp Refills   valACYclovir (VALTREX) 500 MG tablet [Pharmacy Med Name: VALACYCLOVIR HCL 500 MG TABLET] 90 tablet 3    Sig: Take 1 tablet (500 mg total) by mouth daily.     Antimicrobials:  Antiviral Agents - Anti-Herpetic Passed - 04/19/2023  1:15 AM      Passed - Valid encounter within last 12 months    Recent Outpatient Visits           9 months ago Annual physical exam   [facility] Jacky Kindle, FNP   1 year ago Neuropathy   [facility] Jacky Kindle, FNP   1 year ago Dysuria   [facility] Caro Laroche, DO   1 year ago Encounter for screening mammogram for malignant neoplasm of breast   [facility] Merita Norton T, FNP   1 year ago Elevated BP without diagnosis of hypertension   [facility], Marzella Schlein, MD

## 2023-07-08 ENCOUNTER — Ambulatory Visit: Payer: 59 | Admitting: Family Medicine

## 2023-07-08 ENCOUNTER — Encounter: Payer: Self-pay | Admitting: Family Medicine

## 2023-07-08 VITALS — BP 149/82 | HR 64 | Ht 61.0 in | Wt 249.1 lb

## 2023-07-08 DIAGNOSIS — D509 Iron deficiency anemia, unspecified: Secondary | ICD-10-CM | POA: Diagnosis not present

## 2023-07-08 DIAGNOSIS — Z23 Encounter for immunization: Secondary | ICD-10-CM | POA: Diagnosis not present

## 2023-07-08 DIAGNOSIS — R7303 Prediabetes: Secondary | ICD-10-CM

## 2023-07-08 DIAGNOSIS — E559 Vitamin D deficiency, unspecified: Secondary | ICD-10-CM

## 2023-07-08 DIAGNOSIS — Z0289 Encounter for other administrative examinations: Secondary | ICD-10-CM | POA: Insufficient documentation

## 2023-07-08 DIAGNOSIS — Z1231 Encounter for screening mammogram for malignant neoplasm of breast: Secondary | ICD-10-CM

## 2023-07-08 DIAGNOSIS — I10 Essential (primary) hypertension: Secondary | ICD-10-CM

## 2023-07-08 DIAGNOSIS — E7800 Pure hypercholesterolemia, unspecified: Secondary | ICD-10-CM

## 2023-07-08 NOTE — Progress Notes (Signed)
Established patient visit   Patient: Sarah Monroe   DOB: [birth year]   52 y.o. Female  MRN: [id number] Visit Date: 07/08/2023  Today's healthcare provider: Jacky Kindle, FNP  Introduced to nurse practitioner role and practice setting.  All questions answered.  Discussed provider/patient relationship and expectations.  Subjective    HPI  Request for health screening form to be completed for [facility]  Medications: Outpatient Medications Prior to Visit  Medication Sig   Cetirizine HCl 10 MG CAPS Take 10 mg by mouth daily as needed.   Cholecalciferol (VITAMIN D-3) 25 MCG (1000 UT) CAPS Take by mouth.   ferrous sulfate 325 (65 FE) MG EC tablet Take 325 mg by mouth 3 (three) times daily with meals.   gabapentin (NEURONTIN) 300 MG capsule Take 1 capsule (300 mg total) by mouth at bedtime.   meloxicam (MOBIC) 15 MG tablet Take 1 tablet (15 mg total) by mouth daily.   methocarbamol (ROBAXIN-750) 750 MG tablet Take 1 tablet (750 mg total) by mouth in the morning and at bedtime.   pantoprazole (PROTONIX) 40 MG tablet Take 1 tablet (40 mg total) by mouth daily.   scopolamine (TRANSDERM-SCOP) 1 MG/3DAYS Place 1 patch (1.5 mg total) onto the skin every 3 (three) days.   Sodium Sulfate-Mag Sulfate-KCl (SUTAB) [phone] MG TABS Use as instructed once (Patient not taking: Reported on 11/01/2022)   valACYclovir (VALTREX) 500 MG tablet TAKE 1 TABLET (500 MG TOTAL) BY MOUTH DAILY.   No facility-administered medications prior to visit.    Review of Systems  See past labs  Heme  Chem  Endocrine  Serology  Results Review (optional):1}   Objective    BP (!) 149/82 (BP Location: Right Arm, Patient Position: Sitting, Cuff Size: Large)   Pulse 64   Ht 5\' 1"  (1.549 m)   Wt 249 lb 1.6 oz (113 kg)   BMI 47.07 kg/m    Physical Exam Vitals and nursing note reviewed.  Constitutional:      Appearance: Normal appearance. She is obese.  Cardiovascular:     Pulses: Normal pulses.   Pulmonary:     Effort: Pulmonary effort is normal.  Neurological:     General: No focal deficit present.     Mental Status: She is alert and oriented to person, place, and time. Mental status is at baseline.  Psychiatric:        Mood and Affect: Mood normal.        Behavior: Behavior normal.        Thought Content: Thought content normal.        Judgment: Judgment normal.     PE not performed; waist measurement taken for form.  No results found for any visits on 07/08/23.  Assessment & Plan     Problem List Items Addressed This Visit       Cardiovascular and Mediastinum   Primary hypertension   Relevant Orders   CBC with Differential/Platelet   Comprehensive Metabolic Panel (CMET)   TSH     Other   Avitaminosis D   Relevant Orders   Vitamin D (25 hydroxy)   Encounter for completion of form with patient - Primary   Hypercholesteremia   Relevant Orders   Lipid panel   Iron deficiency anemia   Relevant Orders   CBC with Differential/Platelet   Morbidly obese (HCC)   Relevant Orders   CBC with Differential/Platelet   Comprehensive Metabolic Panel (CMET)   Hemoglobin A1c   Lipid panel  Vitamin D (25 hydroxy)   TSH   Prediabetes   Relevant Orders   Hemoglobin A1c   Other Visit Diagnoses     Encounter for screening mammogram for malignant neoplasm of breast       Relevant Orders   MM 3D SCREENING MAMMOGRAM BILATERAL BREAST      Return in about 3 months (around 10/08/2023) for annual examination.     Leilani Merl, FNP, have reviewed all documentation for this visit. The documentation on 07/08/23 for the exam, diagnosis, procedures, and orders are all accurate and complete.  Jacky Kindle, FNP  [facility] [phone] (phone) [phone] (fax)  [facility]

## 2023-07-18 ENCOUNTER — Ambulatory Visit
Admission: RE | Admit: 2023-07-18 | Discharge: 2023-07-18 | Disposition: A | Discharge status: Home / Self Care | Payer: 59 | Source: Ambulatory Visit | Attending: Family Medicine | Admitting: Family Medicine

## 2023-07-18 DIAGNOSIS — Z1231 Encounter for screening mammogram for malignant neoplasm of breast: Secondary | ICD-10-CM | POA: Diagnosis not present

## 2023-10-08 ENCOUNTER — Ambulatory Visit (INDEPENDENT_AMBULATORY_CARE_PROVIDER_SITE_OTHER): Payer: 59 | Admitting: Family Medicine

## 2023-10-08 ENCOUNTER — Telehealth: Payer: Self-pay | Admitting: Family Medicine

## 2023-10-08 ENCOUNTER — Encounter: Payer: Self-pay | Admitting: Family Medicine

## 2023-10-08 VITALS — BP 141/92 | HR 93 | Ht 61.0 in | Wt 255.4 lb

## 2023-10-08 DIAGNOSIS — K219 Gastro-esophageal reflux disease without esophagitis: Secondary | ICD-10-CM

## 2023-10-08 DIAGNOSIS — R7303 Prediabetes: Secondary | ICD-10-CM

## 2023-10-08 DIAGNOSIS — E7800 Pure hypercholesterolemia, unspecified: Secondary | ICD-10-CM

## 2023-10-08 DIAGNOSIS — G4710 Hypersomnia, unspecified: Secondary | ICD-10-CM

## 2023-10-08 DIAGNOSIS — G4730 Sleep apnea, unspecified: Secondary | ICD-10-CM

## 2023-10-08 DIAGNOSIS — I10 Essential (primary) hypertension: Secondary | ICD-10-CM

## 2023-10-08 DIAGNOSIS — Z0001 Encounter for general adult medical examination with abnormal findings: Secondary | ICD-10-CM | POA: Diagnosis not present

## 2023-10-08 DIAGNOSIS — Z820 Family history of epilepsy and other diseases of the nervous system: Secondary | ICD-10-CM | POA: Insufficient documentation

## 2023-10-08 DIAGNOSIS — Z23 Encounter for immunization: Secondary | ICD-10-CM | POA: Diagnosis not present

## 2023-10-08 DIAGNOSIS — B009 Herpesviral infection, unspecified: Secondary | ICD-10-CM

## 2023-10-08 DIAGNOSIS — N951 Menopausal and female climacteric states: Secondary | ICD-10-CM | POA: Insufficient documentation

## 2023-10-08 DIAGNOSIS — E559 Vitamin D deficiency, unspecified: Secondary | ICD-10-CM

## 2023-10-08 DIAGNOSIS — Z0000 Encounter for general adult medical examination without abnormal findings: Secondary | ICD-10-CM

## 2023-10-08 MED ORDER — PANTOPRAZOLE SODIUM 40 MG PO TBEC: 40.0000 mg | DELAYED_RELEASE_TABLET | Freq: Every day | ORAL | 3 refills | Status: AC

## 2023-10-08 MED ORDER — GABAPENTIN 300 MG PO CAPS: 300.0000 mg | ORAL_CAPSULE | Freq: Every evening | ORAL | 0 refills | Status: DC | PRN

## 2023-10-08 MED ORDER — VALACYCLOVIR HCL 500 MG PO TABS: 500.0000 mg | ORAL_TABLET | Freq: Every day | ORAL | 3 refills | Status: DC

## 2023-10-08 MED ORDER — TIRZEPATIDE-WEIGHT MANAGEMENT 2.5 MG/0.5ML ~~LOC~~ SOAJ: 2.5000 mg | SUBCUTANEOUS | 0 refills | Status: DC

## 2023-10-08 NOTE — Progress Notes (Unsigned)
Complete physical exam   Patient: Sarah Monroe   DOB: [birth year]   52 y.o. Female  MRN: [id number] Visit Date: 10/08/2023  Today's healthcare provider: Jacky Kindle, FNP  Introduced to nurse practitioner role and practice setting.  All questions answered.  Discussed provider/patient relationship and expectations.  Chief Complaint  Patient presents with   Annual Exam    Last completed 07/12/22. Patient reports she is trying to consume a low sodium, low fat, low carb diet. She participates in walking 20-30 minutes for 1-2 days a week. Patient reports feeling well and sleeping is off and on and would like to discuss sleep apnea. Reports she is getting up several times throughout the night. She reports concerns of weight gain and what stage of menopause she is currently in.    Subjective    Sarah Monroe is a 52 y.o. female who presents today for a complete physical exam.   HPI HPI     Annual Exam    Additional comments: Last completed 07/12/22. Patient reports she is trying to consume a low sodium, low fat, low carb diet. She participates in walking 20-30 minutes for 1-2 days a week. Patient reports feeling well and sleeping is off and on and would like to discuss sleep apnea. Reports she is getting up several times throughout the night. She reports concerns of weight gain and what stage of menopause she is currently in.       Last edited by Acey Lav, CMA on 10/08/2023  8:54 AM.      The patient, with a history of allergies, elevated blood pressure, sleep disturbances, GERD, weight gain, and HSV infection, presents for a routine physical examination. The patient reports a recent exacerbation of allergies, which started last week, and has been managed with Zyrtec and Flonase. The patient also reports elevated blood pressure readings, which is unusual for her, and denies any known stressors that could contribute to this.  The patient has been experiencing sleep  disturbances, which she attributes to menopause, but also expresses concern about potential sleep apnea due to similarities in symptoms with her mother, who was recently diagnosed with the condition. The patient reports symptoms of dry mouth in the morning, excessive daytime sleepiness, and difficulty sleeping at night.  The patient's GERD symptoms have reportedly improved compared to the previous year. The patient also mentions a stable weight over the past year, but expresses frustration with recent weight gain despite efforts to exercise and control diet. The patient expresses interest in appetite suppressants or other weight loss medications.  The patient also discusses her HSV infection, which is managed with Valtrex. The patient has not had any outbreaks since the initial one and is currently in a relationship where she has not yet disclosed her HSV status. The patient also reports constipation, which she attributes to her diet, particularly her consumption of cheese.  Past Medical History:  Diagnosis Date   Allergy    Seasonal   Anemia    Anxiety    GERD (gastroesophageal reflux disease)    Hyperlipidemia    Pre-diabetes    Past Surgical History:  Procedure Laterality Date   CESAREAN SECTION     x3   CHOLECYSTECTOMY     COLONOSCOPY WITH PROPOFOL N/A 10/18/2016   Procedure: COLONOSCOPY WITH PROPOFOL;  Surgeon: Wyline Mood, MD;  Location: [facility] ENDOSCOPY;  Service: Endoscopy;  Laterality: N/A;   COLONOSCOPY WITH PROPOFOL N/A 11/01/2022   Procedure: COLONOSCOPY WITH PROPOFOL;  Surgeon:  Wyline Mood, MD;  Location[facility] ENDOSCOPY;  Service: Gastroenterology;  Laterality: N/A;   Endometriotic cyst     Dr. Evette Cristal   GALLBLADDER SURGERY  1999   Dr. Lauralee Evener   Post cholcystectomy  1998   TUBAL LIGATION Bilateral    Social History   Socioeconomic History   Marital status: Single    Spouse name: Not on file   Number of children: Not on file   Years of education: Not on file   Highest  education level: Some college, no degree  Occupational History   Not on file  Tobacco Use   Smoking status: Never   Smokeless tobacco: Never  Vaping Use   Vaping status: Never Used  Substance and Sexual Activity   Alcohol use: No    Comment: occassional   Drug use: Never   Sexual activity: Not Currently    Birth control/protection: Abstinence, Surgical, None    Comment: Tubal ligation; monogomous relationship with female  Other Topics Concern   Not on file  Social History Narrative   Works remote with [employer].     Social Determinants of Health   Financial Resource Strain: Low Risk  (10/07/2023)   Overall Financial Resource Strain (CARDIA)    Difficulty of Paying Living Expenses: Not hard at all  Food Insecurity: No Food Insecurity (10/07/2023)   Hunger Vital Sign    Worried About Running Out of Food in the Last Year: Never true    Ran Out of Food in the Last Year: Never true  Transportation Needs: No Transportation Needs (10/07/2023)   PRAPARE - Administrator, Civil Service (Medical): No    Lack of Transportation (Non-Medical): No  Physical Activity: Insufficiently Active (10/07/2023)   Exercise Vital Sign    Days of Exercise per Week: 2 days    Minutes of Exercise per Session: 20 min  Stress: No Stress Concern Present (10/07/2023)   [employer] of Occupational Health - Occupational Stress Questionnaire    Feeling of Stress : Only a little  Social Connections: Unknown (10/07/2023)   Social Connection and Isolation Panel [NHANES]    Frequency of Communication with Friends and Family: More than three times a week    Frequency of Social Gatherings with Friends and Family: More than three times a week    Attends Religious Services: Patient declined    Database administrator or Organizations: No    Attends Engineer, structural: Not on file    Marital Status: Never married  Intimate Partner Violence: Not on file   Family Status  Relation Name Status    Mother Gaytha Raybourn Alive   Father VERNAL RUTAN (deceased) Deceased   MGM Ovidio Hanger (deceased) Deceased at age 91       Pancreatic cancer   MGF  Deceased at age 55       COPD   PGM Darcell Yacoub (deceased) Deceased at age 25       Coronary artery disease;Myocardial Infarction   PGF  Deceased       died of unknown causes; died before patient was born   Building control surveyor   Sister  Alive   Brother MARICRUZ LUCERO Alive   Brother Deron Deering Alive   Brother Leslee Home Alive   Brother  Alive   Sister  Alive   Mat Uncle Eduardo Osier (Not Specified)   Daughter Wilder Glade Pinnix (Not Specified)   Neg Hx  (Not Specified)  No partnership data  on file   Family History  Problem Relation Age of Onset   Allergies Mother    Diabetes Mother        History of DM   Hyperlipidemia Mother    Hypertension Mother        But is well controlled due to diet/exercise   Colon polyps Mother    Arthritis Mother    Cancer Mother    Kidney disease Mother    Miscarriages / [place] Mother    Hypertension Father    Diabetes Father        Type 2   Hernia Father    Heart murmur Father    Dementia Father    Alcohol abuse Father    Cancer Father    Heart disease Father    Alcohol abuse Maternal Grandmother    Cancer Maternal Grandmother    Diabetes Paternal Grandmother    Heart disease Paternal Grandmother    Colon cancer Cousin 50   Hypertension Brother    Cancer Brother    Obesity Brother    Obesity Brother    Cancer Maternal Uncle    Hearing loss Daughter    Learning disabilities Daughter    Breast cancer Neg Hx    No Known Allergies  Patient Care Team: Jacky Kindle, FNP as PCP - General (Family Medicine)   Medications: Outpatient Medications Prior to Visit  Medication Sig   pantoprazole (PROTONIX) 40 MG tablet Take 1 tablet (40 mg total) by mouth daily.   valACYclovir (VALTREX) 500 MG tablet TAKE 1 TABLET (500 MG TOTAL) BY MOUTH DAILY.   Cetirizine HCl 10 MG CAPS Take  10 mg by mouth daily as needed. (Patient not taking: Reported on 10/08/2023)   Cholecalciferol (VITAMIN D-3) 25 MCG (1000 UT) CAPS Take by mouth. (Patient not taking: Reported on 10/08/2023)   ferrous sulfate 325 (65 FE) MG EC tablet Take 325 mg by mouth 3 (three) times daily with meals. (Patient not taking: Reported on 10/08/2023)   gabapentin (NEURONTIN) 300 MG capsule Take 1 capsule (300 mg total) by mouth at bedtime. (Patient not taking: Reported on 10/08/2023)   meloxicam (MOBIC) 15 MG tablet Take 1 tablet (15 mg total) by mouth daily. (Patient not taking: Reported on 10/08/2023)   methocarbamol (ROBAXIN-750) 750 MG tablet Take 1 tablet (750 mg total) by mouth in the morning and at bedtime. (Patient not taking: Reported on 10/08/2023)   scopolamine (TRANSDERM-SCOP) 1 MG/3DAYS Place 1 patch (1.5 mg total) onto the skin every 3 (three) days. (Patient not taking: Reported on 10/08/2023)   Sodium Sulfate-Mag Sulfate-KCl (SUTAB) 3677521997 MG TABS Use as instructed once (Patient not taking: Reported on 10/08/2023)   No facility-administered medications prior to visit.    Review of Systems Last CBC Lab Results  Component Value Date   WBC 4.5 07/08/2023   HGB 13.5 07/08/2023   HCT 41.8 07/08/2023   MCV 84 07/08/2023   MCH 27.1 07/08/2023   RDW 13.3 07/08/2023   PLT 305 07/08/2023   Last metabolic panel Lab Results  Component Value Date   GLUCOSE 101 (H) 07/08/2023   NA 142 07/08/2023   K 4.1 07/08/2023   CL 104 07/08/2023   CO2 23 07/08/2023   BUN 11 07/08/2023   CREATININE 0.76 07/08/2023   EGFR 95 07/08/2023   CALCIUM 10.0 07/08/2023   PROT 7.1 07/08/2023   ALBUMIN 4.3 07/08/2023   LABGLOB 2.8 07/08/2023   AGRATIO 1.6 07/12/2022   BILITOT 0.6 07/08/2023   ALKPHOS 73  07/08/2023   AST 18 07/08/2023   ALT 19 07/08/2023   ANIONGAP 8 08/13/2019   Last lipids Lab Results  Component Value Date   CHOL 221 (H) 07/08/2023   HDL 66 07/08/2023   LDLCALC 137 (H) 07/08/2023   TRIG 101  07/08/2023   CHOLHDL 3.3 07/08/2023   Last hemoglobin A1c Lab Results  Component Value Date   HGBA1C 6.0 (H) 07/08/2023   Last thyroid functions Lab Results  Component Value Date   TSH 1.190 07/08/2023   Last vitamin D Lab Results  Component Value Date   VD25OH 28.2 (L) 07/08/2023   Last vitamin B12 and Folate Lab Results  Component Value Date   VITAMINB12 455 03/18/2022   FOLATE 7.2 03/18/2022    Objective    BP (!) 141/92 (BP Location: Left Arm, Patient Position: Sitting, Cuff Size: Large)   Pulse 93   Ht 5\' 1"  (1.549 m)   Wt 255 lb 6.4 oz (115.8 kg)   SpO2 100%   BMI 48.26 kg/m   BP Readings from Last 3 Encounters:  10/08/23 (!) 141/92  07/08/23 (!) 149/82  11/01/22 129/82   Wt Readings from Last 3 Encounters:  10/08/23 255 lb 6.4 oz (115.8 kg)  07/08/23 249 lb 1.6 oz (113 kg)  11/01/22 249 lb 12.8 oz (113.3 kg)   SpO2 Readings from Last 3 Encounters:  10/08/23 100%  11/01/22 100%  09/27/22 98%   Physical Exam Vitals and nursing note reviewed.  Constitutional:      General: She is awake. She is not in acute distress.    Appearance: Normal appearance. She is well-developed and well-groomed. She is obese. She is not ill-appearing, toxic-appearing or diaphoretic.  HENT:     Head: Normocephalic and atraumatic.     Jaw: There is normal jaw occlusion. No trismus, tenderness, swelling or pain on movement.     Right Ear: Hearing, tympanic membrane, ear canal and external ear normal. There is no impacted cerumen.     Left Ear: Hearing, tympanic membrane, ear canal and external ear normal. There is no impacted cerumen.     Nose: Nose normal. No congestion or rhinorrhea.     Right Turbinates: Not enlarged, swollen or pale.     Left Turbinates: Not enlarged, swollen or pale.     Right Sinus: No maxillary sinus tenderness or frontal sinus tenderness.     Left Sinus: No maxillary sinus tenderness or frontal sinus tenderness.     Mouth/Throat:     Lips: Pink.      Mouth: Mucous membranes are moist. No injury.     Tongue: No lesions.     Pharynx: Oropharynx is clear. Uvula midline. No pharyngeal swelling, oropharyngeal exudate, posterior oropharyngeal erythema or uvula swelling.     Tonsils: No tonsillar exudate or tonsillar abscesses.  Eyes:     General: Lids are normal. Lids are everted, no foreign bodies appreciated. Vision grossly intact. Gaze aligned appropriately. No allergic shiner or visual field deficit.       Right eye: No discharge.        Left eye: No discharge.     Extraocular Movements: Extraocular movements intact.     Conjunctiva/sclera: Conjunctivae normal.     Right eye: Right conjunctiva is not injected. No exudate.    Left eye: Left conjunctiva is not injected. No exudate.    Pupils: Pupils are equal, round, and reactive to light.  Neck:     Thyroid: No thyroid mass, thyromegaly or thyroid tenderness.  Vascular: No carotid bruit.     Trachea: Trachea normal.  Cardiovascular:     Rate and Rhythm: Normal rate and regular rhythm.     Pulses: Normal pulses.          Carotid pulses are 2+ on the right side and 2+ on the left side.      Radial pulses are 2+ on the right side and 2+ on the left side.       Dorsalis pedis pulses are 2+ on the right side and 2+ on the left side.       Posterior tibial pulses are 2+ on the right side and 2+ on the left side.     Heart sounds: Normal heart sounds, S1 normal and S2 normal. No murmur heard.    No friction rub. No gallop.  Pulmonary:     Effort: Pulmonary effort is normal. No respiratory distress.     Breath sounds: Normal breath sounds and air entry. No stridor. No wheezing, rhonchi or rales.  Chest:     Chest wall: No tenderness.  Abdominal:     General: Abdomen is flat. Bowel sounds are normal. There is no distension.     Palpations: Abdomen is soft. There is no mass.     Tenderness: There is no abdominal tenderness. There is no right CVA tenderness, left CVA tenderness, guarding  or rebound.     Hernia: No hernia is present.  Genitourinary:    Comments: Exam deferred; denies complaints Musculoskeletal:        General: No swelling, tenderness, deformity or signs of injury. Normal range of motion.     Cervical back: Full passive range of motion without pain, normal range of motion and neck supple. No edema, rigidity or tenderness. No muscular tenderness.     Right lower leg: No edema.     Left lower leg: No edema.  Lymphadenopathy:     Cervical: No cervical adenopathy.     Right cervical: No superficial, deep or posterior cervical adenopathy.    Left cervical: No superficial, deep or posterior cervical adenopathy.  Skin:    General: Skin is warm and dry.     Capillary Refill: Capillary refill takes less than 2 seconds.     Coloration: Skin is not jaundiced or pale.     Findings: No bruising, erythema, lesion or rash.  Neurological:     General: No focal deficit present.     Mental Status: She is alert and oriented to person, place, and time. Mental status is at baseline.     GCS: GCS eye subscore is 4. GCS verbal subscore is 5. GCS motor subscore is 6.     Sensory: Sensation is intact. No sensory deficit.     Motor: Motor function is intact. No weakness.     Coordination: Coordination is intact. Coordination normal.     Gait: Gait is intact. Gait normal.  Psychiatric:        Attention and Perception: Attention and perception normal.        Mood and Affect: Mood and affect normal.        Speech: Speech normal.        Behavior: Behavior normal. Behavior is cooperative.        Thought Content: Thought content normal.        Cognition and Memory: Cognition and memory normal.        Judgment: Judgment normal.     Last depression screening scores    07/12/2022  2:19 PM 01/21/2022    9:28 AM 07/23/2021    1:15 PM  PHQ 2/9 Scores  PHQ - 2 Score 0 0 0  PHQ- 9 Score 2 0 0   Last fall risk screening    07/12/2022    2:19 PM  Fall Risk   Falls in the past  year? 0  Number falls in past yr: 0  Injury with Fall? 0   Last Audit-C alcohol use screening    10/07/2023    5:07 PM  Alcohol Use Disorder Test (AUDIT)  1. How often do you have a drink containing alcohol? 0  3. How often do you have six or more drinks on one occasion? 0   A score of 3 or more in women, and 4 or more in men indicates increased risk for alcohol abuse, EXCEPT if all of the points are from question 1   No results found for any visits on 10/08/23.  Assessment & Plan    Routine Health Maintenance and Physical Exam  Exercise Activities and Dietary recommendations  Goals      Exercise 150 minutes per week (moderate activity)        Immunization History  Administered Date(s) Administered   Influenza,inj,Quad PF,6+ Mos 12/31/2013, 08/23/2015   PFIZER(Purple Top)SARS-COV-2 Vaccination 05/13/2020, 06/03/2020   Td 01/16/2005   Tdap 12/31/2013   Zoster Recombinant(Shingrix) 07/08/2023    Health Maintenance  Topic Date Due   COVID-19 Vaccine (3 - 2023-24 season) 08/03/2023   Zoster Vaccines- Shingrix (2 of 2) 09/02/2023   INFLUENZA VACCINE  03/01/2024 (Originally 07/03/2023)   DTaP/Tdap/Td (3 - Td or Tdap) 01/01/2024   MAMMOGRAM  07/17/2025   Cervical Cancer Screening (HPV/Pap Cotest)  01/21/2027   Colonoscopy  11/02/2027   Hepatitis C Screening  Completed   HIV Screening  Completed   HPV VACCINES  Aged Out    Discussed health benefits of physical activity, and encouraged her to engage in regular exercise appropriate for her age and condition.  Problem List Items Addressed This Visit       Other   Annual physical exam - Primary   Other Visit Diagnoses     Immunization due       Influenza vaccine needed       Need for shingles vaccine          Allergic Rhinitis Exacerbation of symptoms for the past week. Improvement noted with Zyrtec and Flonase. Discussed the potential benefit of adding a nasal antihistamine like azelastine. -Continue Zyrtec and  Flonase. -Consider adding azelastine if symptoms persist.  Hypertension Elevated diastolic blood pressure noted today. Patient has not been monitoring blood pressure at home and is unsure if stress could be a contributing factor. -Monitor blood pressure at home. -Consider lifestyle modifications and stress management techniques. -Trial of antihypertensive to assist  Sleep Disturbances Patient reports worsening sleep issues, possibly related to menopause or potential sleep apnea. Family history of sleep apnea noted. -Complete sleep study questionnaire. -Consider home sleep apnea test. -Discuss potential treatment options based on results, including CPAP or dental appliance.     No data to display           Gastroesophageal Reflux Disease (GERD) Symptoms have improved compared to the previous year. -Continue Protonix as needed.  Herpes Simplex Virus (HSV) Patient requests refill of Valtrex for suppression therapy. -Refill Valtrex for daily use.  Neuropathic Pain Outdated prescription for gabapentin. -Refill gabapentin prescription.  Weight Management Patient reports increased appetite and difficulty losing weight despite  exercise and diet efforts. Discussed potential appetite suppressants and weight loss medications, including Contrave and GLP-1 receptor agonists. -Check labs to assess for progression of prediabetes. -Discuss potential weight loss medications based on lab results and insurance coverage.  Menopause Patient reports potential symptoms of menopause and is interested in checking hormone levels. -Order FSH, LH, and estrogen labs.  General Health Maintenance -Administer flu vaccine today. -Order routine labs including CBC, metabolic panel, and thyroid function tests. -Follow up in 6 months to monitor A1c.  Leilani Merl, FNP, have reviewed all documentation for this visit. The documentation on 10/08/23 for the exam, diagnosis, procedures, and orders are all  accurate and complete.  Jacky Kindle, FNP  [facility] [phone] (phone) [phone] (fax)  [facility]

## 2023-10-08 NOTE — Telephone Encounter (Signed)
Covermymeds is requesting prior authorization Key: [id number] Zepbound 2.5mg /0.26ml auto injectors has been rejected and requires PA

## 2023-10-09 ENCOUNTER — Other Ambulatory Visit: Payer: Self-pay | Admitting: Family Medicine

## 2023-10-09 MED ORDER — LISINOPRIL-HYDROCHLOROTHIAZIDE 20-12.5 MG PO TABS: 1.0000 | ORAL_TABLET | Freq: Every day | ORAL | 3 refills | Status: DC

## 2023-10-09 MED ORDER — ROSUVASTATIN CALCIUM 10 MG PO TABS: 10.0000 mg | ORAL_TABLET | Freq: Every day | ORAL | 3 refills | Status: DC

## 2023-10-09 MED ORDER — VITAMIN D (ERGOCALCIFEROL) 1.25 MG (50000 UNIT) PO CAPS: 50000.0000 [IU] | ORAL_CAPSULE | ORAL | 0 refills | Status: AC

## 2023-10-09 NOTE — Telephone Encounter (Signed)
PA initiated and notes faxed to be attach

## 2023-10-12 LAB — CBC WITH DIFFERENTIAL/PLATELET
Basophils Absolute: 0 10*3/uL (ref 0.0–0.2)
Basos: 0 %
EOS (ABSOLUTE): 0.2 10*3/uL (ref 0.0–0.4)
Eos: 5 %
Hematocrit: 42.1 % (ref 34.0–46.6)
Hemoglobin: 13.6 g/dL (ref 11.1–15.9)
Immature Grans (Abs): 0 10*3/uL (ref 0.0–0.1)
Immature Granulocytes: 0 %
Lymphocytes Absolute: 1.5 10*3/uL (ref 0.7–3.1)
Lymphs: 31 %
MCH: 27.2 pg (ref 26.6–33.0)
MCHC: 32.3 g/dL (ref 31.5–35.7)
MCV: 84 fL (ref 79–97)
Monocytes Absolute: 0.4 10*3/uL (ref 0.1–0.9)
Monocytes: 9 %
Neutrophils Absolute: 2.6 10*3/uL (ref 1.4–7.0)
Neutrophils: 55 %
Platelets: 295 10*3/uL (ref 150–450)
RBC: 5 x10E6/uL (ref 3.77–5.28)
RDW: 12.6 % (ref 11.7–15.4)
WBC: 4.7 10*3/uL (ref 3.4–10.8)

## 2023-10-12 LAB — LIPID PANEL
Chol/HDL Ratio: 3.6 ratio (ref 0.0–4.4)
Cholesterol, Total: 225 mg/dL — ABNORMAL HIGH (ref 100–199)
HDL: 63 mg/dL (ref >39)
LDL Chol Calc (NIH): 145 mg/dL — ABNORMAL HIGH (ref 0–99)
Triglycerides: 97 mg/dL (ref 0–149)
VLDL Cholesterol Cal: 17 mg/dL (ref 5–40)

## 2023-10-12 LAB — COMPREHENSIVE METABOLIC PANEL
ALT: 16 [IU]/L (ref 0–32)
AST: 14 [IU]/L (ref 0–40)
Albumin: 4.3 g/dL (ref 3.8–4.9)
Alkaline Phosphatase: 80 [IU]/L (ref 44–121)
BUN/Creatinine Ratio: 14 (ref 9–23)
BUN: 11 mg/dL (ref 6–24)
Bilirubin Total: 0.6 mg/dL (ref 0.0–1.2)
CO2: 24 mmol/L (ref 20–29)
Calcium: 10.3 mg/dL — ABNORMAL HIGH (ref 8.7–10.2)
Chloride: 99 mmol/L (ref 96–106)
Creatinine, Ser: 0.77 mg/dL (ref 0.57–1.00)
Globulin, Total: 2.9 g/dL (ref 1.5–4.5)
Glucose: 111 mg/dL — ABNORMAL HIGH (ref 70–99)
Potassium: 4.2 mmol/L (ref 3.5–5.2)
Sodium: 139 mmol/L (ref 134–144)
Total Protein: 7.2 g/dL (ref 6.0–8.5)
eGFR: 93 mL/min/{1.73_m2} (ref >59)

## 2023-10-12 LAB — HEMOGLOBIN A1C
Est. average glucose Bld gHb Est-mCnc: 131 mg/dL
Hgb A1c MFr Bld: 6.2 % — ABNORMAL HIGH (ref 4.8–5.6)

## 2023-10-12 LAB — FSH/LH
FSH: 42.3 m[IU]/mL
LH: 34.9 m[IU]/mL

## 2023-10-12 LAB — VITAMIN D 25 HYDROXY (VIT D DEFICIENCY, FRACTURES): Vit D, 25-Hydroxy: 44.1 ng/mL (ref 30.0–100.0)

## 2023-10-12 LAB — TSH: TSH: 1.1 u[IU]/mL (ref 0.450–4.500)

## 2023-10-12 LAB — ESTRADIOL: Estradiol: 35.5 pg/mL

## 2023-10-12 LAB — ESTROGENS, TOTAL: Estrogen: 117 pg/mL

## 2023-10-20 ENCOUNTER — Telehealth: Payer: Self-pay

## 2023-10-20 NOTE — Telephone Encounter (Signed)
 Copied from CRM [id number]. Topic: Referral - Request for Referral >> Oct 20, 2023 12:34 PM Everette C wrote: Reason for CRM: Amy with [facility] has called regarding a recently received referral for an in lab sleep study for the patient  Amy has been in contact with the patient who shares that they would prefer an in home sleep study   An updated referral has been requested to be faxed to [facility]. The referral can be sent to [phone]  Please contact Amy further if needed

## 2023-10-23 ENCOUNTER — Other Ambulatory Visit: Payer: Self-pay | Admitting: Family Medicine

## 2023-10-23 DIAGNOSIS — I10 Essential (primary) hypertension: Secondary | ICD-10-CM

## 2023-10-23 DIAGNOSIS — Z820 Family history of epilepsy and other diseases of the nervous system: Secondary | ICD-10-CM

## 2023-10-23 DIAGNOSIS — K219 Gastro-esophageal reflux disease without esophagitis: Secondary | ICD-10-CM

## 2023-10-23 DIAGNOSIS — R7303 Prediabetes: Secondary | ICD-10-CM

## 2023-10-23 DIAGNOSIS — G4730 Sleep apnea, unspecified: Secondary | ICD-10-CM

## 2023-10-24 ENCOUNTER — Other Ambulatory Visit: Payer: Self-pay | Admitting: Family Medicine

## 2023-11-01 ENCOUNTER — Other Ambulatory Visit: Payer: Self-pay | Admitting: Family Medicine

## 2023-11-03 NOTE — Telephone Encounter (Signed)
Requested medication (s) are due for refill today- yes  Requested medication (s) are on the active medication list -yes  Future visit scheduled -yes  Last refill: 10/08/23 2ml  Notes to clinic: off protocol- provider review   Requested Prescriptions  Pending Prescriptions Disp Refills   ZEPBOUND 2.5 MG/0.5ML Pen [Pharmacy Med Name: ZEPBOUND 2.5 MG/0.5 ML PEN]      Sig: INJECT 2.5 MG SUBCUTANEOUSLY WEEKLY     Off-Protocol Failed - 11/01/2023  9:59 AM      Failed - Medication not assigned to a protocol, review manually.      Passed - Valid encounter within last 12 months    Recent Outpatient Visits           3 weeks ago Annual physical exam   [facility] Merita Norton T, FNP   3 months ago Encounter for completion of form with patient   [facility] Jacky Kindle, FNP   1 year ago Annual physical exam   [facility] Jacky Kindle, FNP   1 year ago Neuropathy   [facility] Jacky Kindle, FNP   1 year ago Dysuria   [facility] Caro Laroche, DO       Future Appointments             In 5 months Jacky Kindle, FNP [facility]   In 11 months Jacky Kindle, FNP [facility], [facility]               Requested Prescriptions  Pending Prescriptions Disp Refills   ZEPBOUND 2.5 MG/0.5ML Pen [Pharmacy Med Name: ZEPBOUND 2.5 MG/0.5 ML PEN]      Sig: INJECT 2.5 MG SUBCUTANEOUSLY WEEKLY     Off-Protocol Failed - 11/01/2023  9:59 AM      Failed - Medication not assigned to a protocol, review manually.      Passed - Valid encounter within last 12 months    Recent Outpatient Visits           3 weeks ago Annual physical exam    [facility] Jacky Kindle, FNP   3 months ago Encounter for completion of form with patient   [facility]  Jacky Kindle, FNP   1 year ago Annual physical exam   [facility] Jacky Kindle, FNP   1 year ago Neuropathy   [facility] Jacky Kindle, FNP   1 year ago Dysuria   [facility] Caro Laroche, DO       Future Appointments             In 5 months Jacky Kindle, FNP [facility], [facility]   In 11 months Jacky Kindle, FNP [facility], [facility]

## 2023-11-04 ENCOUNTER — Other Ambulatory Visit: Payer: Self-pay | Admitting: Family Medicine

## 2023-11-04 MED ORDER — TIRZEPATIDE-WEIGHT MANAGEMENT 5 MG/0.5ML ~~LOC~~ SOAJ: 5.0000 mg | SUBCUTANEOUS | 0 refills | Status: DC

## 2023-11-19 IMAGING — US US EXTREM UP*L* LTD
1 series · 14 of 16 positions shown · non-contrast
Comparison: None.

CLINICAL DATA: Injury in [REDACTED], pain in the elbow crease.

EXAM:
ULTRASOUND LEFT UPPER EXTREMITY LIMITED
TECHNIQUE: Ultrasound examination of the upper extremity soft tissues was
performed in the area of clinical concern.

[Series 1: us extrem up*left* ltd · 0.06mm/px · 16 acquisitions, 14 frames shown]
[im 1/16]
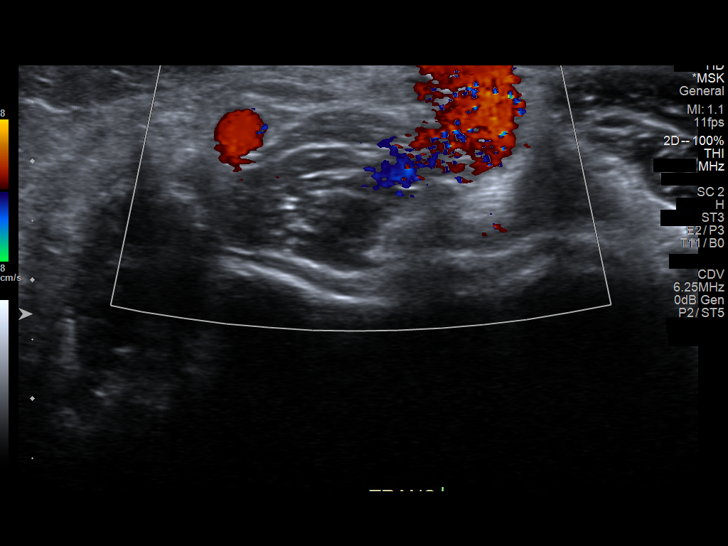
[im 2/16]
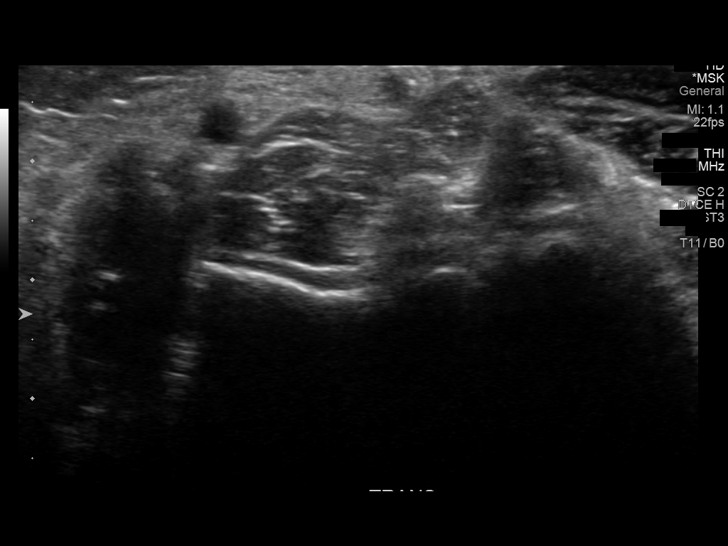
[im 3/16]
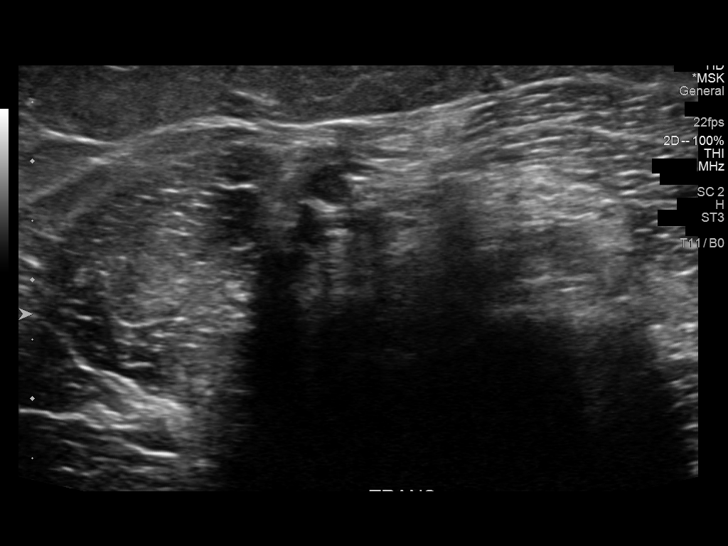
[im 5/16]
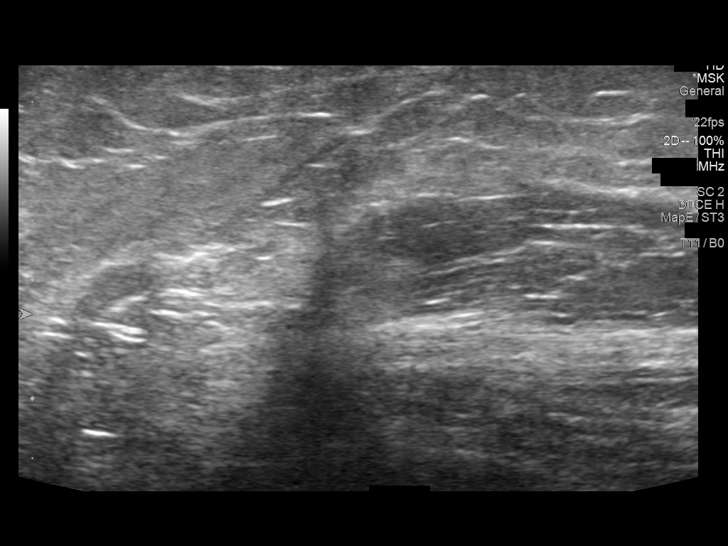
[im 6/16]
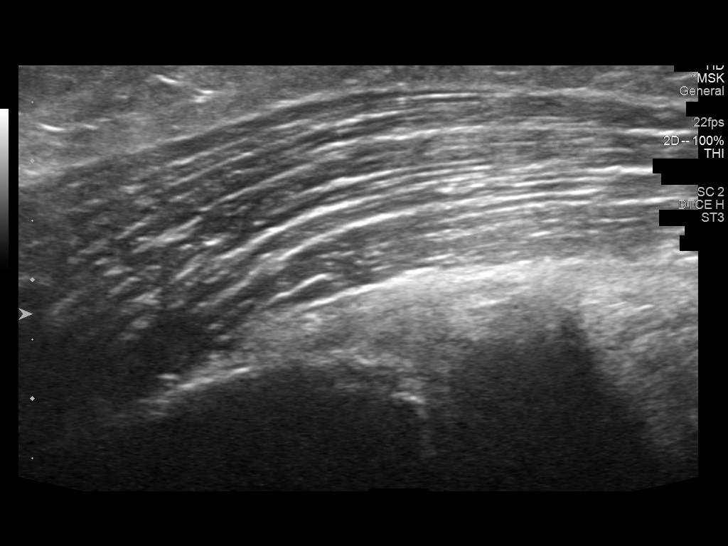
[im 7/16]
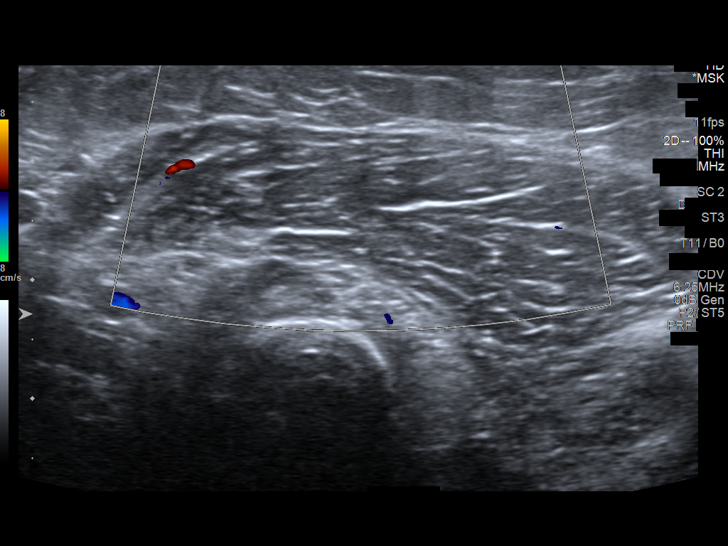
[im 8/16]
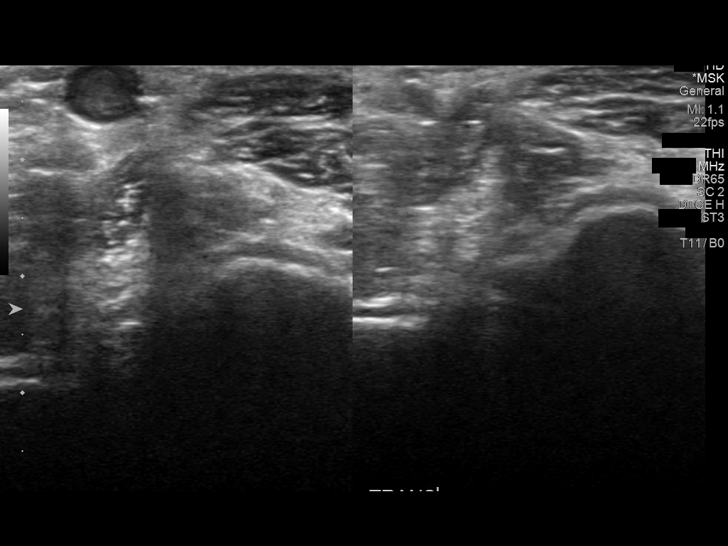
[im 9/16]
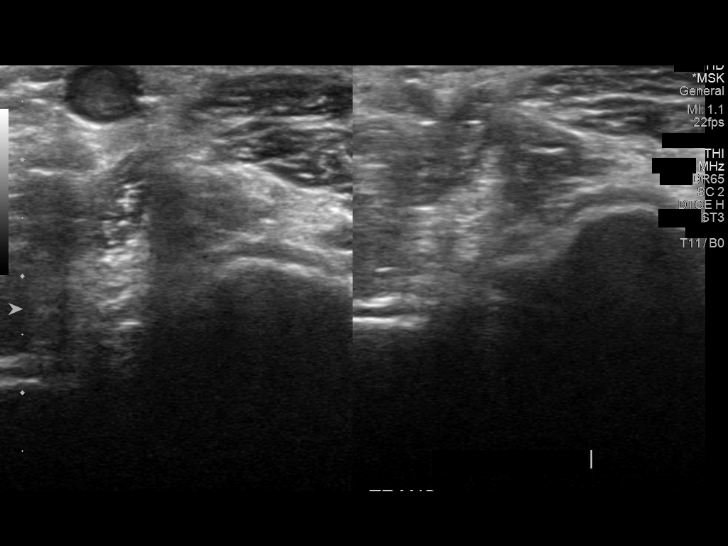
[im 10/16]
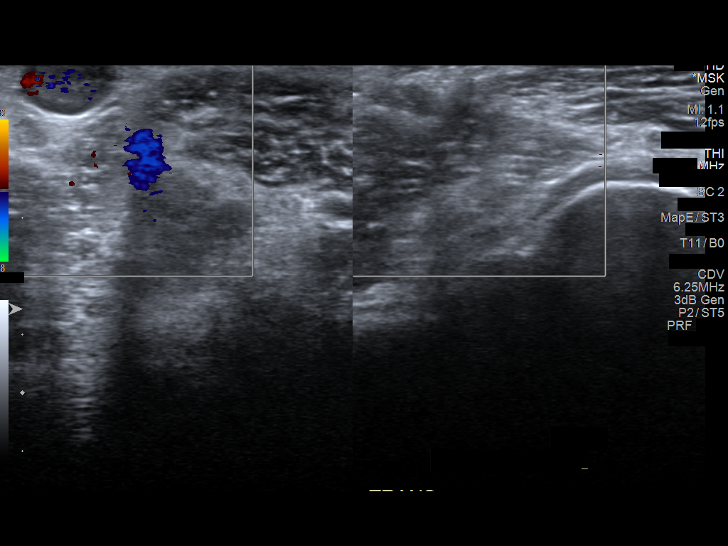
[im 11/16]
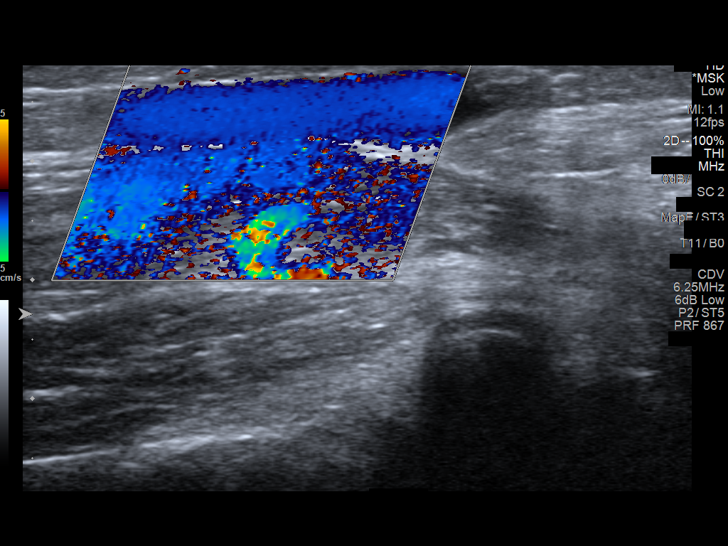
[im 13/16]
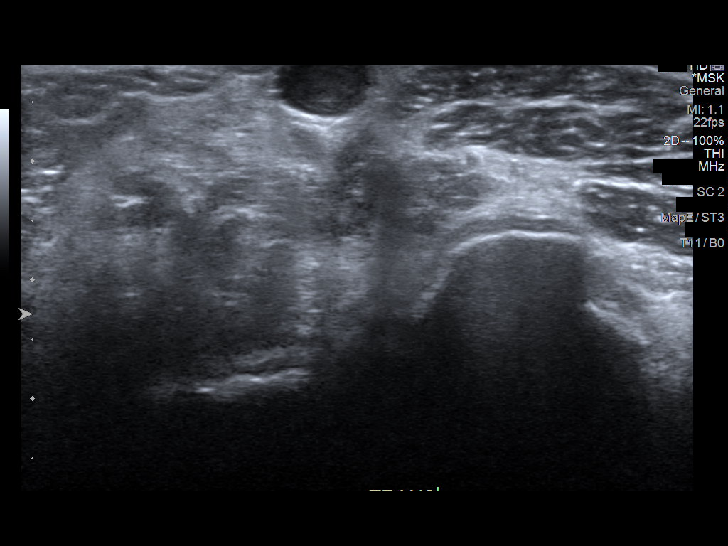
[im 14/16]
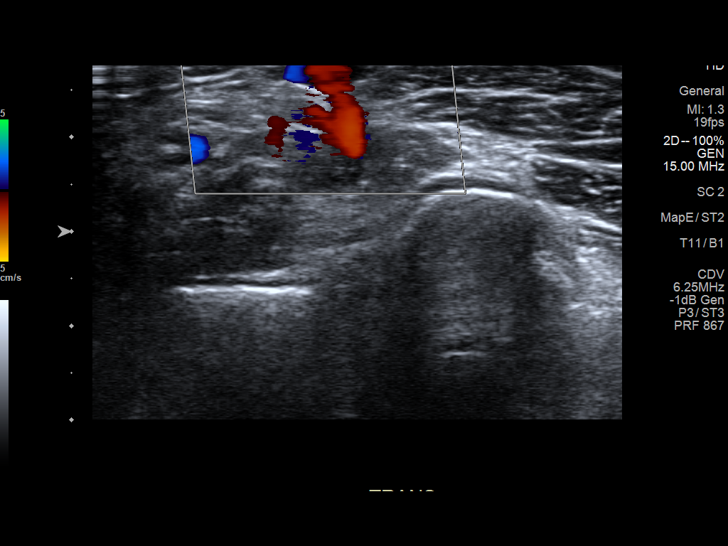
[im 15/16]
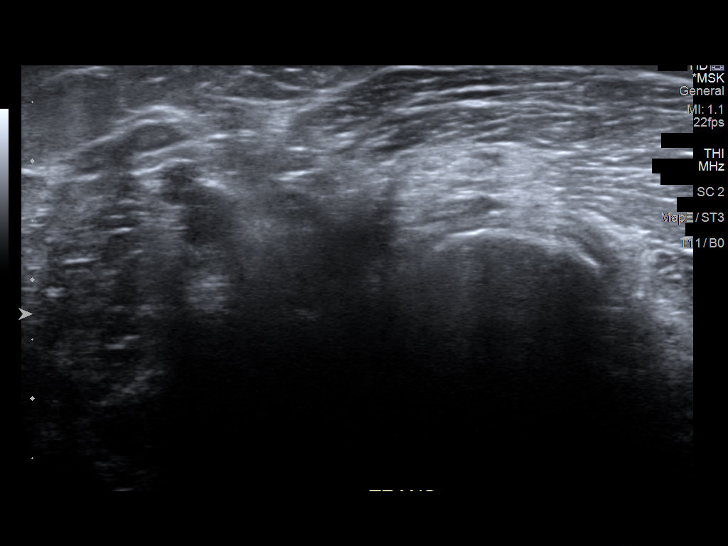
[im 16/16]
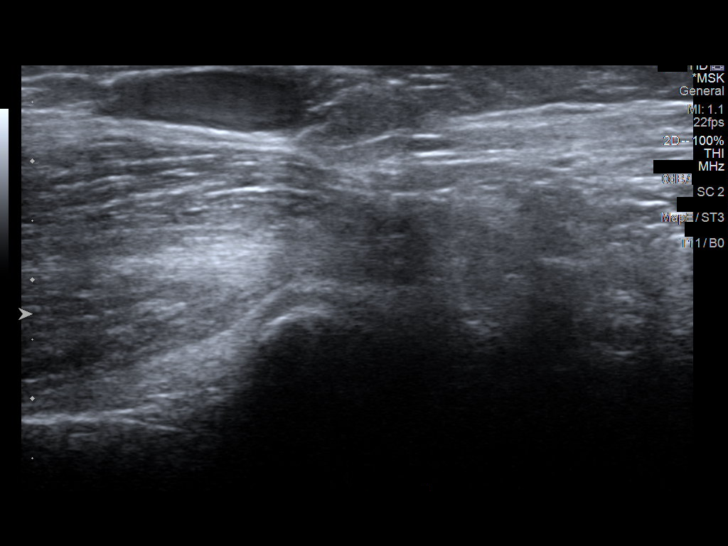

[14 of 16 positions shown; findings below may reference images not displayed]

FINDINGS: Normal subcutaneous soft tissues in the antecubital fossa. No fluid
collection or abscess. No appreciable foreign body.
IMPRESSION: Normal examination of the area of concern in the left elbow.

## 2023-11-29 ENCOUNTER — Other Ambulatory Visit: Payer: Self-pay | Admitting: Family Medicine

## 2023-12-02 ENCOUNTER — Other Ambulatory Visit: Payer: Self-pay | Admitting: Family Medicine

## 2023-12-02 MED ORDER — TIRZEPATIDE-WEIGHT MANAGEMENT 7.5 MG/0.5ML ~~LOC~~ SOAJ: 7.5000 mg | SUBCUTANEOUS | 0 refills | Status: DC

## 2023-12-04 ENCOUNTER — Encounter: Payer: Self-pay | Admitting: Family Medicine

## 2023-12-04 ENCOUNTER — Telehealth: Payer: Self-pay

## 2023-12-04 NOTE — Telephone Encounter (Signed)
 Requested medication (s) are due for refill today:   No  This is not the correct dose.  Requested medication (s) are on the active medication list:   No    dose is changed  Future visit scheduled:   Yes with Dr. Lang   Last ordered: 12/02/2023 for 5 mg.    The dose has been increased to 7.5 mg.    Unable to refill due to incorrect dose and no protocol assigned.      Requested Prescriptions  Pending Prescriptions Disp Refills   ZEPBOUND  5 MG/0.5ML Pen [Pharmacy Med Name: ZEPBOUND  5 MG/0.5 ML PEN]      Sig: INJECT 5 MG SUBCUTANEOUSLY WEEKLY     Off-Protocol Failed - 12/04/2023 12:34 PM      Failed - Medication not assigned to a protocol, review manually.      Passed - Valid encounter within last 12 months    Recent Outpatient Visits           1 month ago Annual physical exam   [facility] Emilio Kelly DASEN, FNP   4 months ago Encounter for completion of form with patient   [facility] Emilio Kelly DASEN, FNP   1 year ago Annual physical exam   [facility] Emilio Kelly DASEN, FNP   1 year ago Neuropathy   [facility] Emilio Kelly DASEN, FNP   1 year ago Dysuria   [facility] Madelon Donald HERO, DO       Future Appointments             In 4 months Simmons-Robinson, Rockie, MD [facility], [facility]   In 10 months Emilio, Kelly DASEN, FNP [facility], [facility]

## 2023-12-04 NOTE — Telephone Encounter (Signed)
 Copied from CRM [id number]. Topic: General - Other >> Dec 02, 2023  9:54 AM Yvone Marda Blow wrote: Patient [place] stated she is calling in regards to the status of my zepbound  dosage.  Will I be staying at the same dosage or will it be increased? January 4th take my next injection. Also,  will my new provider be aware of all my medical history. Since Kelly is leaving Jan 3rd Please advise.

## 2023-12-04 NOTE — Telephone Encounter (Signed)
**Note De-identified  [place] Obfuscation** Please advise 

## 2023-12-07 ENCOUNTER — Telehealth (INDEPENDENT_AMBULATORY_CARE_PROVIDER_SITE_OTHER): Payer: 59 | Admitting: Pulmonary Disease

## 2023-12-07 DIAGNOSIS — G4733 Obstructive sleep apnea (adult) (pediatric): Secondary | ICD-10-CM | POA: Diagnosis not present

## 2023-12-07 NOTE — Telephone Encounter (Signed)
 HST showed mild  OSA with AHI 13.5 / hr , worse when supine Options include CPAP or dental  appliance or weight loss alone depending on symptom burden

## 2023-12-09 NOTE — Telephone Encounter (Signed)
 Zepbound was increased to 7.5mg  weekly on 12/02/23 so I recommend continuing at that dose and scheduling follow up with new PCP in 1-2 months to discuss dose titrations   New PCP will be able to review notes from previous visits to review medical hx

## 2023-12-10 NOTE — Telephone Encounter (Signed)
 Patient advised and has appointment already scheduled

## 2023-12-28 ENCOUNTER — Encounter: Payer: Self-pay | Admitting: Family Medicine

## 2023-12-29 ENCOUNTER — Other Ambulatory Visit: Payer: Self-pay

## 2023-12-29 ENCOUNTER — Telehealth: Payer: Self-pay | Admitting: Family Medicine

## 2023-12-29 NOTE — Telephone Encounter (Signed)
[facility] is requesting refill tirzepatide (ZEPBOUND) 7.5 MG/0.5ML Pen  Please advise

## 2023-12-30 MED ORDER — TIRZEPATIDE-WEIGHT MANAGEMENT 7.5 MG/0.5ML ~~LOC~~ SOAJ: 7.5000 mg | SUBCUTANEOUS | 0 refills | Status: DC

## 2024-02-01 ENCOUNTER — Other Ambulatory Visit: Payer: Self-pay | Admitting: Family Medicine

## 2024-02-02 NOTE — Telephone Encounter (Signed)
 Requested medication (s) are due for refill today: Yes  Requested medication (s) are on the active medication list:Yes  Last refill:  12/30/23  Future visit scheduled: Yes  Notes to clinic:  Unable to refill due to no refill protocol for this medication.      Requested Prescriptions  Pending Prescriptions Disp Refills   ZEPBOUND 7.5 MG/0.5ML Pen [Pharmacy Med Name: ZEPBOUND 7.5 MG/0.5 ML PEN]      Sig: INJECT 7.5 MG SUBCUTANEOUSLY WEEKLY     Off-Protocol Failed - 02/02/2024  4:40 PM      Failed - Medication not assigned to a protocol, review manually.      Passed - Valid encounter within last 12 months    Recent Outpatient Visits           3 months ago Annual physical exam    [facility] Jacky Kindle, FNP   6 months ago Encounter for completion of form with patient   [facility] Jacky Kindle, FNP   1 year ago Annual physical exam   [facility] Jacky Kindle, FNP   1 year ago Neuropathy   [facility] Jacky Kindle, FNP   2 years ago Dysuria   [facility] Caro Laroche, DO       Future Appointments             In 2 months Simmons-Robinson, Tawanna Cooler, MD [facility], [facility]

## 2024-02-03 NOTE — Telephone Encounter (Signed)
 LOV 11.6.2024 last seen by Boneta Lucks NOV 05.6.2025 LRF 1.28.2025 LABS 11.6.2024

## 2024-02-04 ENCOUNTER — Other Ambulatory Visit: Payer: Self-pay | Admitting: Family Medicine

## 2024-02-05 ENCOUNTER — Other Ambulatory Visit: Payer: Self-pay | Admitting: Family Medicine

## 2024-02-05 ENCOUNTER — Encounter: Payer: Self-pay | Admitting: Family Medicine

## 2024-02-05 NOTE — Telephone Encounter (Signed)
 Copied from CRM [id number]. Topic: Clinical - Medication Refill >> Feb 05, 2024  9:47 AM Ivette P wrote: Most Recent Primary Care Visit:  Provider: Merita Norton T  Department: [facility]  Visit Type: PHYSICAL  Date: 10/08/2023  Medication: tirzepatide (ZEPBOUND) 7.5 MG/0.5ML Pen  Has the patient contacted their pharmacy? Yes (Agent: If no, request that the patient contact the pharmacy for the refill. If patient does not wish to contact the pharmacy document the reason why and proceed with request.) (Agent: If yes, when and what did the pharmacy advise?)  Is this the correct pharmacy for this prescription? Yes If no, delete pharmacy and type the correct one.  This is the patient's preferred pharmacy:  [id number] [place], [place] - [address] [place] [address] Phone: [phone] Fax: [phone]   Has the prescription been filled recently? No  Is the patient out of the medication? Yes  Has the patient been seen for an appointment in the last year OR does the patient have an upcoming appointment? Yes  Can we respond through MyChart? Yes  Agent: Please be advised that Rx refills may take up to 3 business days. We ask that you follow-up with your pharmacy.

## 2024-02-05 NOTE — Telephone Encounter (Signed)
 Duplicate encounter, please see refill requests in chart dated 02/01/24 and 02/04/24 regarding same medication. 02/04/24 medication refill request has been routed to [employer] Nurse on 02/04/24. If patient calls back please advise refills can take up to 48-72 hours. Closing this duplicate encounter.  Copied from CRM [id number]. Topic: Clinical - Medication Refill >> Feb 05, 2024  9:47 AM Ivette P wrote: Most Recent Primary Care Visit:  Provider: Merita Norton T  Department: [facility]  Visit Type: PHYSICAL  Date: 10/08/2023  Medication: tirzepatide (ZEPBOUND) 7.5 MG/0.5ML Pen  Has the patient contacted their pharmacy? Yes (Agent: If no, request that the patient contact the pharmacy for the refill. If patient does not wish to contact the pharmacy document the reason why and proceed with request.) (Agent: If yes, when and what did the pharmacy advise?)  Is this the correct pharmacy for this prescription? Yes If no, delete pharmacy and type the correct one.  This is the patient's preferred pharmacy:  [id number] [place], [place] - [address] [place] [address] Phone: [phone] Fax: [phone]   Has the prescription been filled recently? No  Is the patient out of the medication? Yes  Has the patient been seen for an appointment in the last year OR does the patient have an upcoming appointment? Yes  Can we respond through MyChart? Yes  Agent: Please be advised that Rx refills may take up to 3 business days. We ask that you follow-up with your pharmacy.

## 2024-02-06 ENCOUNTER — Other Ambulatory Visit: Payer: Self-pay | Admitting: Family Medicine

## 2024-02-06 MED ORDER — ZEPBOUND 7.5 MG/0.5ML ~~LOC~~ SOAJ: 7.5000 mg | SUBCUTANEOUS | 1 refills | Status: DC

## 2024-03-09 ENCOUNTER — Other Ambulatory Visit (HOSPITAL_COMMUNITY): Payer: Self-pay

## 2024-04-06 ENCOUNTER — Encounter: Payer: Self-pay | Admitting: Family Medicine

## 2024-04-06 ENCOUNTER — Ambulatory Visit: Payer: Self-pay | Admitting: Family Medicine

## 2024-04-06 ENCOUNTER — Ambulatory Visit: Payer: 59 | Admitting: Family Medicine

## 2024-04-06 VITALS — BP 120/78 | HR 90 | Ht 61.0 in | Wt 225.0 lb

## 2024-04-06 DIAGNOSIS — Z23 Encounter for immunization: Secondary | ICD-10-CM | POA: Diagnosis not present

## 2024-04-06 DIAGNOSIS — E559 Vitamin D deficiency, unspecified: Secondary | ICD-10-CM

## 2024-04-06 DIAGNOSIS — I10 Essential (primary) hypertension: Secondary | ICD-10-CM

## 2024-04-06 DIAGNOSIS — T753XXD Motion sickness, subsequent encounter: Secondary | ICD-10-CM

## 2024-04-06 DIAGNOSIS — T50905A Adverse effect of unspecified drugs, medicaments and biological substances, initial encounter: Secondary | ICD-10-CM

## 2024-04-06 DIAGNOSIS — R7303 Prediabetes: Secondary | ICD-10-CM

## 2024-04-06 DIAGNOSIS — E7800 Pure hypercholesterolemia, unspecified: Secondary | ICD-10-CM

## 2024-04-06 DIAGNOSIS — G4733 Obstructive sleep apnea (adult) (pediatric): Secondary | ICD-10-CM

## 2024-04-06 DIAGNOSIS — R0683 Snoring: Secondary | ICD-10-CM

## 2024-04-06 DIAGNOSIS — L819 Disorder of pigmentation, unspecified: Secondary | ICD-10-CM

## 2024-04-06 NOTE — Progress Notes (Unsigned)
 Established patient visit   Patient: Sarah Monroe   DOB: 1971/09/13   53 y.o. Female  MRN: [id number] Visit Date: 04/06/2024  Today's healthcare provider: Mimi Alt, MD   Chief Complaint  Patient presents with   Establish Care    Discuss A1c, weight, CPAP and possible referral to Derm   Care Management    Pattern of eating   General diet   Watching her sodium intake    Are you exercising   What exercising: cardo   How long:60min  How frequent: 3day a week   Agree to tdap vaccine    Subjective     HPI     Establish Care    Additional comments: Discuss A1c, weight, CPAP and possible referral to [facility]        Care Management    Additional comments: Pattern of eating   General diet   Watching her sodium intake    Are you exercising   What exercising: cardo   How long:29min  How frequent: 3day a week   Agree to tdap vaccine       Last edited by Bart Lieu, CMA on 04/06/2024  9:56 AM.       Discussed the use of AI scribe software for clinical note transcription with the patient, who gave verbal consent to proceed.  History of Present Illness SHONNIE Monroe is a 53 year old female who presents for A1c follow-up and weight management.  She is currently on Zepbound  7.5 mg once weekly, which was increased in March. The medication aids in appetite control, resulting in a 30-pound weight loss since November. She experiences nausea, tiredness, and chills for the first one to two days post-administration but otherwise tolerates it well. She does not feel the need to increase the dose at this time.  She has a history of hypertension, well-managed with lisinopril  20 mg and hydrochlorothiazide  12.5 mg daily. Her blood pressure remains consistently within normal range, and she is satisfied with her current management. She is also on Crestor  10 mg daily for hyperlipidemia management.  She expresses concerns about sleep apnea. A home sleep  study last year indicated mild obstructive sleep apnea with an AHI of 13.5 events per hour, exacerbated when supine. Symptoms include snoring, waking with a dry mouth, and morning fatigue.  She takes Protonix  40 mg once daily for reflux, which is better controlled but still flares with high acidity foods like pineapple and grapes. She attributes some symptoms to her history of gallbladder removal.  She inquires about alternative breast cancer screening methods, preferring to avoid mammograms. She has a family history of breast cancer affecting her mother, sisters, and grandmother.  She seeks a dermatology referral for hyperpigmentation and dark marks on her face and breast, previously treated with steroid injections. She has a history of treatment with [facility] and desires care closer to home.     Past Medical History:  Diagnosis Date   Allergy    Seasonal   Anemia    Anxiety    GERD (gastroesophageal reflux disease)    Hyperlipidemia    Pre-diabetes     Medications: Outpatient Medications Prior to Visit  Medication Sig   Cetirizine HCl 10 MG CAPS Take 10 mg by mouth daily as needed.   gabapentin  (NEURONTIN ) 300 MG capsule Take 1 capsule (300 mg total) by mouth at bedtime as needed.   lisinopril -hydrochlorothiazide  (ZESTORETIC ) 20-12.5 MG tablet Take 1 tablet by mouth daily.  pantoprazole  (PROTONIX ) 40 MG tablet Take 1 tablet (40 mg total) by mouth daily.   rosuvastatin  (CRESTOR ) 10 MG tablet Take 1 tablet (10 mg total) by mouth daily.   tirzepatide  (ZEPBOUND ) 7.5 MG/0.5ML Pen Inject 7.5 mg into the skin once a week.   valACYclovir  (VALTREX ) 500 MG tablet Take 1 tablet (500 mg total) by mouth daily.   Vitamin D , Ergocalciferol , (DRISDOL ) 1.25 MG (50000 UNIT) CAPS capsule Take 1 capsule (50,000 Units total) by mouth every 7 (seven) days.   No facility-administered medications prior to visit.    Review of Systems  Last metabolic panel Lab Results  Component  Value Date   GLUCOSE 88 04/06/2024   NA 141 04/06/2024   K 3.5 04/06/2024   CL 101 04/06/2024   CO2 24 04/06/2024   BUN 12 04/06/2024   CREATININE 0.83 04/06/2024   EGFR 85 04/06/2024   CALCIUM  10.4 (H) 04/06/2024   PROT 7.2 10/08/2023   ALBUMIN 4.3 10/08/2023   LABGLOB 2.9 10/08/2023   AGRATIO 1.6 07/12/2022   BILITOT 0.6 10/08/2023   ALKPHOS 80 10/08/2023   AST 14 10/08/2023   ALT 16 10/08/2023   ANIONGAP 8 08/13/2019   Last lipids Lab Results  Component Value Date   CHOL 159 04/06/2024   HDL 60 04/06/2024   LDLCALC 87 04/06/2024   TRIG 60 04/06/2024   CHOLHDL 2.7 04/06/2024   Last hemoglobin A1c Lab Results  Component Value Date   HGBA1C 5.5 04/06/2024   Last thyroid  functions Lab Results  Component Value Date   TSH 1.100 10/08/2023        Objective    BP 120/78   Pulse 90   Ht 5\' 1"  (1.549 m)   Wt 225 lb (102.1 kg)   SpO2 98%   BMI 42.51 kg/m  BP Readings from Last 3 Encounters:  04/06/24 120/78  10/08/23 (!) 141/92  07/08/23 (!) 149/82   Wt Readings from Last 3 Encounters:  04/06/24 225 lb (102.1 kg)  10/08/23 255 lb 6.4 oz (115.8 kg)  07/08/23 249 lb 1.6 oz (113 kg)        Physical Exam Vitals reviewed.  Constitutional:      General: She is not in acute distress.    Appearance: Normal appearance. She is not ill-appearing, toxic-appearing or diaphoretic.  Eyes:     Conjunctiva/sclera: Conjunctivae normal.  Cardiovascular:     Rate and Rhythm: Normal rate and regular rhythm.     Pulses: Normal pulses.     Heart sounds: Normal heart sounds. No murmur heard.    No friction rub. No gallop.  Pulmonary:     Effort: Pulmonary effort is normal. No respiratory distress.     Breath sounds: Normal breath sounds. No stridor. No wheezing, rhonchi or rales.  Abdominal:     General: Bowel sounds are normal. There is no distension.     Palpations: Abdomen is soft.     Tenderness: There is no abdominal tenderness.  Musculoskeletal:     Right  lower leg: No edema.     Left lower leg: No edema.  Skin:    Findings: No erythema or rash.  Neurological:     Mental Status: She is alert and oriented to person, place, and time.  Psychiatric:        Mood and Affect: Mood and affect normal.        Speech: Speech normal.        Behavior: Behavior normal. Behavior is cooperative.  Results for orders placed or performed in visit on 04/06/24  Lipid panel  Result Value Ref Range   Cholesterol, Total 159 100 - 199 mg/dL   Triglycerides 60 0 - 149 mg/dL   HDL 60 >29 mg/dL   VLDL Cholesterol Cal 12 5 - 40 mg/dL   LDL Chol Calc (NIH) 87 0 - 99 mg/dL   Chol/HDL Ratio 2.7 0.0 - 4.4 ratio  VITAMIN D  25 Hydroxy (Vit-D Deficiency, Fractures)  Result Value Ref Range   Vit D, 25-Hydroxy 57.3 30.0 - 100.0 ng/mL  BMP8+EGFR  Result Value Ref Range   Glucose 88 70 - 99 mg/dL   BUN 12 6 - 24 mg/dL   Creatinine, Ser 5.28 0.57 - 1.00 mg/dL   eGFR 85 >41 LK/GMW/1.02   BUN/Creatinine Ratio 14 9 - 23   Sodium 141 134 - 144 mmol/L   Potassium 3.5 3.5 - 5.2 mmol/L   Chloride 101 96 - 106 mmol/L   CO2 24 20 - 29 mmol/L   Calcium  10.4 (H) 8.7 - 10.2 mg/dL  Hemoglobin V2Z  Result Value Ref Range   Hgb A1c MFr Bld 5.5 4.8 - 5.6 %   Est. average glucose Bld gHb Est-mCnc 111 mg/dL    Assessment & Plan     Problem List Items Addressed This Visit       Cardiovascular and Mediastinum   Primary hypertension - Primary    Essential (primary) hypertension, Chronic Hypertension is well-controlled with current medication regimen. Blood pressure reading is 120/78 mmHg. - Continue lisinopril  20 mg daily. - Continue hydrochlorothiazide  12.5 mg daily.      Relevant Orders   Hemoglobin A1c   BMP8+EGFR (Completed)     Musculoskeletal and Integument   Hyperpigmentation   Relevant Orders   Ambulatory referral to Dermatology     Other   Prediabetes   Relevant Orders   Hemoglobin A1c   Morbidly obese (HCC)   Obesity, unspecified Obesity  with BMI of 42. Weight management is ongoing with Zepbound  7.5 mg once weekly, resulting in a 30-pound weight loss since November. She reports mild nausea, tiredness, and chills for 1-2 days post-administration but finds the medication effective in managing appetite. - Continue Zepbound  7.5 mg once weekly.      Relevant Orders   Hemoglobin A1c   Hypercholesteremia   Mixed hyperlipidemia,Chronic Mixed hyperlipidemia is being managed with Crestor . - Continue Crestor  10 mg daily.      Relevant Orders   Hemoglobin A1c   Lipid panel (Completed)   Avitaminosis D   Relevant Orders   VITAMIN D  25 Hydroxy (Vit-D Deficiency, Fractures) (Completed)   Other Visit Diagnoses       Snoring       Relevant Orders   Hemoglobin A1c     Mild obstructive sleep apnea       Relevant Orders   Hemoglobin A1c   For home use only DME continuous positive airway pressure (CPAP)       Assessment & Plan  Type 2 diabetes mellitus without complications A1c is 6.2, indicating good glycemic control. - Order A1c test, metabolic panel, and cholesterol levels.    Mild obstructive sleep apnea Mild obstructive sleep apnea with AHI of 13.5 events per hour, worse when supine. Symptoms include snoring, dry mouth, and daytime tiredness. CPAP therapy is considered beneficial. Alternatives such as a dental appliance were discussed but not preferred. - Order CPAP for auto-titration. - If CPAP is not approved due to outdated sleep study, order a new  home sleep study.  Gastro-esophageal reflux disease without esophagitis GERD symptoms are present, particularly after consuming high acidity foods. Symptoms are managed with Protonix  as needed. - Continue Protonix  40 mg as needed.  - getting tetanus booster today   Return in about 6 months (around 10/08/2024) for CPE.         Mimi Alt, MD  [facility] [phone] (phone) [phone] (fax)  [facility]

## 2024-04-07 LAB — BMP8+EGFR
BUN/Creatinine Ratio: 14 (ref 9–23)
BUN: 12 mg/dL (ref 6–24)
CO2: 24 mmol/L (ref 20–29)
Calcium: 10.4 mg/dL — ABNORMAL HIGH (ref 8.7–10.2)
Chloride: 101 mmol/L (ref 96–106)
Creatinine, Ser: 0.83 mg/dL (ref 0.57–1.00)
Glucose: 88 mg/dL (ref 70–99)
Potassium: 3.5 mmol/L (ref 3.5–5.2)
Sodium: 141 mmol/L (ref 134–144)
eGFR: 85 mL/min/{1.73_m2} (ref >59)

## 2024-04-07 LAB — VITAMIN D 25 HYDROXY (VIT D DEFICIENCY, FRACTURES): Vit D, 25-Hydroxy: 57.3 ng/mL (ref 30.0–100.0)

## 2024-04-07 LAB — HEMOGLOBIN A1C
Est. average glucose Bld gHb Est-mCnc: 111 mg/dL
Hgb A1c MFr Bld: 5.5 % (ref 4.8–5.6)

## 2024-04-07 LAB — LIPID PANEL
Chol/HDL Ratio: 2.7 ratio (ref 0.0–4.4)
Cholesterol, Total: 159 mg/dL (ref 100–199)
HDL: 60 mg/dL (ref >39)
LDL Chol Calc (NIH): 87 mg/dL (ref 0–99)
Triglycerides: 60 mg/dL (ref 0–149)
VLDL Cholesterol Cal: 12 mg/dL (ref 5–40)

## 2024-04-07 NOTE — Assessment & Plan Note (Signed)
  Essential (primary) hypertension, Chronic Hypertension is well-controlled with current medication regimen. Blood pressure reading is 120/78 mmHg. - Continue lisinopril  20 mg daily. - Continue hydrochlorothiazide  12.5 mg daily.

## 2024-04-07 NOTE — Assessment & Plan Note (Signed)
 Mixed hyperlipidemia,Chronic Mixed hyperlipidemia is being managed with Crestor . - Continue Crestor  10 mg daily.

## 2024-04-07 NOTE — Assessment & Plan Note (Signed)
 Obesity, unspecified Obesity with BMI of 42. Weight management is ongoing with Zepbound  7.5 mg once weekly, resulting in a 30-pound weight loss since November. She reports mild nausea, tiredness, and chills for 1-2 days post-administration but finds the medication effective in managing appetite. - Continue Zepbound  7.5 mg once weekly.

## 2024-04-08 ENCOUNTER — Encounter: Payer: Self-pay | Admitting: Family Medicine

## 2024-04-08 NOTE — Telephone Encounter (Signed)
Please see the message below and advise.

## 2024-04-09 ENCOUNTER — Encounter: Payer: Self-pay | Admitting: Family Medicine

## 2024-04-09 MED ORDER — SCOPOLAMINE 1 MG/3DAYS TD PT72: 1.0000 | MEDICATED_PATCH | TRANSDERMAL | 3 refills | Status: AC

## 2024-04-09 NOTE — Addendum Note (Signed)
 Addended by: SIMMONS-ROBINSON, Iyannah Blake L on: 04/09/2024 02:03 PM   Modules accepted: Orders

## 2024-04-12 NOTE — Telephone Encounter (Signed)
 FYI please see the pt response below

## 2024-05-06 ENCOUNTER — Other Ambulatory Visit (HOSPITAL_COMMUNITY): Payer: Self-pay

## 2024-05-06 ENCOUNTER — Telehealth: Payer: Self-pay

## 2024-05-06 ENCOUNTER — Encounter: Payer: Self-pay | Admitting: Family Medicine

## 2024-05-06 NOTE — Telephone Encounter (Signed)
 Pharmacy Patient Advocate Encounter   Received notification from Onbase that prior authorization for Zepbound  7.5MG /0.5ML pen-injectors  is required/requested.   Insurance verification completed.   The patient is insured through [facility] .   Per test claim: PA required; PA submitted to above mentioned insurance via CoverMyMeds Key/confirmation #/EOC [id number] Status is pending

## 2024-05-07 NOTE — Telephone Encounter (Signed)
 Patient advised via mychart on 05/07/24 and would like to be notified of determination via mychart.

## 2024-07-25 ENCOUNTER — Encounter: Payer: Self-pay | Admitting: Family Medicine

## 2024-07-26 ENCOUNTER — Other Ambulatory Visit: Payer: Self-pay | Admitting: Family Medicine

## 2024-07-26 MED ORDER — ZEPBOUND 10 MG/0.5ML ~~LOC~~ SOAJ: 10.0000 mg | SUBCUTANEOUS | 2 refills | Status: AC

## 2024-07-26 NOTE — Telephone Encounter (Signed)
Please see the message below and advise.

## 2024-09-08 ENCOUNTER — Other Ambulatory Visit: Payer: Self-pay | Admitting: Family Medicine

## 2024-09-08 DIAGNOSIS — Z1231 Encounter for screening mammogram for malignant neoplasm of breast: Secondary | ICD-10-CM

## 2024-10-08 ENCOUNTER — Encounter: Payer: 59 | Admitting: Family Medicine

## 2024-10-11 ENCOUNTER — Encounter: Payer: Self-pay | Admitting: Family Medicine

## 2024-10-11 ENCOUNTER — Ambulatory Visit
Admission: RE | Admit: 2024-10-11 | Discharge: 2024-10-11 | Disposition: A | Discharge status: Home / Self Care | Source: Ambulatory Visit | Attending: Family Medicine | Admitting: Family Medicine

## 2024-10-11 ENCOUNTER — Ambulatory Visit: Admitting: Family Medicine

## 2024-10-11 VITALS — BP 119/64 | HR 55 | Temp 98.4°F | Ht 61.0 in | Wt 223.8 lb

## 2024-10-11 DIAGNOSIS — R7303 Prediabetes: Secondary | ICD-10-CM

## 2024-10-11 DIAGNOSIS — Z1231 Encounter for screening mammogram for malignant neoplasm of breast: Secondary | ICD-10-CM | POA: Diagnosis present

## 2024-10-11 DIAGNOSIS — Z23 Encounter for immunization: Secondary | ICD-10-CM

## 2024-10-11 DIAGNOSIS — Z0000 Encounter for general adult medical examination without abnormal findings: Secondary | ICD-10-CM

## 2024-10-11 DIAGNOSIS — E7800 Pure hypercholesterolemia, unspecified: Secondary | ICD-10-CM

## 2024-10-11 DIAGNOSIS — E559 Vitamin D deficiency, unspecified: Secondary | ICD-10-CM

## 2024-10-11 DIAGNOSIS — Z789 Other specified health status: Secondary | ICD-10-CM

## 2024-10-11 DIAGNOSIS — K219 Gastro-esophageal reflux disease without esophagitis: Secondary | ICD-10-CM | POA: Diagnosis not present

## 2024-10-11 DIAGNOSIS — D509 Iron deficiency anemia, unspecified: Secondary | ICD-10-CM

## 2024-10-11 DIAGNOSIS — Z0001 Encounter for general adult medical examination with abnormal findings: Secondary | ICD-10-CM

## 2024-10-11 DIAGNOSIS — I10 Essential (primary) hypertension: Secondary | ICD-10-CM

## 2024-10-11 DIAGNOSIS — G629 Polyneuropathy, unspecified: Secondary | ICD-10-CM

## 2024-10-11 NOTE — Patient Instructions (Signed)
 To keep you healthy, please keep in mind the following health maintenance items that you are due for:   Health Maintenance Due  Topic Date Due   Hepatitis B Vaccines 19-59 Average Risk (1 of 3 - 19+ 3-dose series) Never done   Pneumococcal Vaccine: 50+ Years (1 of 1 - PCV) Never done   Influenza Vaccine  07/02/2024     Best Wishes,   Dr. Lang

## 2024-10-11 NOTE — Progress Notes (Signed)
 Complete physical exam   Patient: Sarah Monroe   DOB: 1971/05/14   53 y.o. Female  MRN: [id number] Visit Date: 10/11/2024  Today's healthcare provider: Rockie Agent, MD   Chief Complaint  Patient presents with   Annual Exam    Patient presents for annual exam with pcp.  Diet is well balanced per patient, following high protein and low carbs. Not exercising currently, plans to restart.   Vaccines: Will get flu, wait on prevnar. Hep B titer for immunity  Screenings: UTD, will get mammogram today     Subjective    AVRIANA Monroe is a 53 y.o. female who presents today for a complete physical exam.    She does not have additional problems to discuss today.   Discussed the use of AI scribe software for clinical note transcription with the patient, who gave verbal consent to proceed.  History of Present Illness Sarah Monroe is a 53 year old female who presents for an annual physical exam.  She maintains a well-balanced diet, focusing on high protein and low carbohydrates. She does not currently exercise but plans to restart her routine after a recent lapse due to work commitments. She has gained five pounds since stopping her exercise routine and monitors her weight bi-monthly.  Her medical history includes vitamin D  deficiency, hypercholesterolemia, GERD, hypertension, prediabetes, neuropathy, and iron deficiency anemia. She is currently on Protonix  for GERD and is interested in checking her vitamin B12 levels due to her neuropathy and Protonix  use. She also wants her hepatitis B immunity titer drawn.  Her blood pressure is well controlled, with a recent reading of 119/64. She is up to date on her colon cancer screening and plans to schedule her mammogram for breast cancer screening. She has had a recent dental extraction on the left side, resulting in some facial swelling and limited mouth opening.  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     Past Medical History:  Diagnosis Date   Allergy    Seasonal   Anemia    Anxiety    GERD (gastroesophageal reflux disease)    Hyperlipidemia    Pre-diabetes    Past Surgical History:  Procedure Laterality Date   CESAREAN SECTION     x3   CHOLECYSTECTOMY     COLONOSCOPY WITH PROPOFOL  N/A 10/18/2016   Procedure: COLONOSCOPY WITH PROPOFOL ;  Surgeon: Ruel Kung, MD;  Location: [facility] ENDOSCOPY;  Service: Endoscopy;  Laterality: N/A;   COLONOSCOPY WITH PROPOFOL  N/A 11/01/2022   Procedure: COLONOSCOPY WITH PROPOFOL ;  Surgeon: Kung Ruel, MD;  Location: [facility] ENDOSCOPY;  Service: Gastroenterology;  Laterality: N/A;   DENTAL RESTORATION/EXTRACTION WITH X-RAY Left    Endometriotic cyst     Dr. Dellie   GALLBLADDER SURGERY  1999   Dr. Bambi   Post cholcystectomy  1998   TUBAL LIGATION Bilateral    Social History   Socioeconomic History   Marital status: Single    Spouse name: Not on file   Number of children: Not on file   Years of education: Not on file   Highest education level: Some college, no degree  Occupational History   Not on file  Tobacco Use   Smoking status: Never   Smokeless tobacco: Never  Vaping Use   Vaping status: Never Used  Substance and Sexual Activity   Alcohol use: No    Comment:  None at all per patient   Drug use: Never   Sexual activity: Not Currently    Birth control/protection: Abstinence, Surgical, None    Comment: Tubal ligation; monogomous relationship with female  Other Topics Concern   Not on file  Social History Narrative   Works remote with [employer].     Social Drivers of Corporate Investment Banker Strain: Low Risk  (04/05/2024)   Overall Financial Resource Strain (CARDIA)    Difficulty of Paying Living Expenses: Not hard at all  Food Insecurity: No Food Insecurity (04/05/2024)   Hunger Vital Sign    Worried About Running  Out of Food in the Last Year: Never true    Ran Out of Food in the Last Year: Never true  Transportation Needs: No Transportation Needs (04/05/2024)   PRAPARE - Administrator, Civil Service (Medical): No    Lack of Transportation (Non-Medical): No  Physical Activity: Insufficiently Active (04/05/2024)   Exercise Vital Sign    Days of Exercise per Week: 3 days    Minutes of Exercise per Session: 30 min  Stress: No Stress Concern Present (04/05/2024)   [employer] of Occupational Health - Occupational Stress Questionnaire    Feeling of Stress : Not at all  Social Connections: Unknown (04/05/2024)   Social Connection and Isolation Panel    Frequency of Communication with Friends and Family: More than three times a week    Frequency of Social Gatherings with Friends and Family: More than three times a week    Attends Religious Services: Patient declined    Database Administrator or Organizations: No    Attends Engineer, Structural: Not on file    Marital Status: Never married  Intimate Partner Violence: Not At Risk (04/06/2024)   Humiliation, Afraid, Rape, and Kick questionnaire    Fear of Current or Ex-Partner: No    Emotionally Abused: No    Physically Abused: No    Sexually Abused: No   Family Status  Relation Name Status   Mother Udell Blasingame Alive   Father MEMORI SAMMON (deceased) Deceased   MGM Alfonse Vernard Sharps (deceased) Deceased at age 37       Pancreatic cancer   MGF  Deceased at age 33       COPD   PGM Amie Cowens (deceased) Deceased at age 26       Coronary artery disease;Myocardial Infarction   PGF  Deceased       died of unknown causes; died before patient was born   Building Control Surveyor   Sister  Alive   Brother MARZELLA MIRACLE Alive   Brother Deron Riding Alive   Brother Nancyann Riding Alive   Brother  Alive   Sister  Alive   Mat Uncle Ozell Sharps (Not Specified)   Daughter Virgina Pinnix (Not Specified)   Neg Hx  (Not Specified)  No  partnership data on file   Family History  Problem Relation Age of Onset   Allergies Mother    Diabetes Mother        History of DM   Hyperlipidemia Mother    Hypertension Mother        But is well controlled due to diet/exercise   Colon polyps Mother    Arthritis Mother    Cancer Mother    Kidney disease Mother    Miscarriages / Stillbirths Mother    Hypertension Father    Diabetes Father  Type 2   Hernia Father    Heart murmur Father    Dementia Father    Alcohol abuse Father    Cancer Father    Heart disease Father    Alcohol abuse Maternal Grandmother    Cancer Maternal Grandmother    Diabetes Paternal Grandmother    Heart disease Paternal Grandmother    Colon cancer Cousin 50   Hypertension Brother    Cancer Brother    Obesity Brother    Obesity Brother    Cancer Maternal Uncle    Hearing loss Daughter    Learning disabilities Daughter    Breast cancer Neg Hx    No Known Allergies   Medications: Outpatient Medications Prior to Visit  Medication Sig   acetaminophen -codeine (TYLENOL  #3) 300-30 MG tablet Take 1 tablet by mouth every 6 (six) hours as needed.   amoxicillin  (AMOXIL ) 500 MG capsule Take 500 mg by mouth 3 (three) times daily.   Cetirizine HCl 10 MG CAPS Take 10 mg by mouth daily as needed.   gabapentin  (NEURONTIN ) 300 MG capsule Take 1 capsule (300 mg total) by mouth at bedtime as needed.   lisinopril -hydrochlorothiazide  (ZESTORETIC ) 20-12.5 MG tablet Take 1 tablet by mouth daily.   pantoprazole  (PROTONIX ) 40 MG tablet Take 1 tablet (40 mg total) by mouth daily.   rosuvastatin  (CRESTOR ) 10 MG tablet Take 1 tablet (10 mg total) by mouth daily.   scopolamine  (TRANSDERM-SCOP) 1 MG/3DAYS Place 1 patch (1.5 mg total) onto the skin every 3 (three) days.   tirzepatide  (ZEPBOUND ) 10 MG/0.5ML Pen Inject 10 mg into the skin once a week.   valACYclovir  (VALTREX ) 500 MG tablet Take 1 tablet (500 mg total) by mouth daily.   Vitamin D , Ergocalciferol ,  (DRISDOL ) 1.25 MG (50000 UNIT) CAPS capsule Take 1 capsule (50,000 Units total) by mouth every 7 (seven) days.   No facility-administered medications prior to visit.    Review of Systems  Last CBC Lab Results  Component Value Date   WBC 4.7 10/08/2023   HGB 13.6 10/08/2023   HCT 42.1 10/08/2023   MCV 84 10/08/2023   MCH 27.2 10/08/2023   RDW 12.6 10/08/2023   PLT 295 10/08/2023   Last metabolic panel Lab Results  Component Value Date   GLUCOSE 88 04/06/2024   NA 141 04/06/2024   K 3.5 04/06/2024   CL 101 04/06/2024   CO2 24 04/06/2024   BUN 12 04/06/2024   CREATININE 0.83 04/06/2024   EGFR 85 04/06/2024   CALCIUM  10.4 (H) 04/06/2024   PROT 7.2 10/08/2023   ALBUMIN 4.3 10/08/2023   LABGLOB 2.9 10/08/2023   AGRATIO 1.6 07/12/2022   BILITOT 0.6 10/08/2023   ALKPHOS 80 10/08/2023   AST 14 10/08/2023   ALT 16 10/08/2023   ANIONGAP 8 08/13/2019   Last lipids Lab Results  Component Value Date   CHOL 159 04/06/2024   HDL 60 04/06/2024   LDLCALC 87 04/06/2024   TRIG 60 04/06/2024   CHOLHDL 2.7 04/06/2024   Last hemoglobin A1c Lab Results  Component Value Date   HGBA1C 5.5 04/06/2024   Last thyroid  functions Lab Results  Component Value Date   TSH 1.100 10/08/2023   FREET4 1.04 07/12/2022   Last vitamin D  Lab Results  Component Value Date   VD25OH 57.3 04/06/2024   Last vitamin B12 and Folate Lab Results  Component Value Date   VITAMINB12 455 03/18/2022   FOLATE 7.2 03/18/2022       Objective    BP  119/64 (BP Location: Right Arm, Patient Position: Sitting, Cuff Size: Normal)   Pulse (!) 55   Temp 98.4 F (36.9 C) (Oral)   Ht 5' 1 (1.549 m)   Wt 223 lb 12.8 oz (101.5 kg)   SpO2 100%   BMI 42.29 kg/m  BP Readings from Last 3 Encounters:  10/11/24 119/64  04/06/24 120/78  10/08/23 (!) 141/92   Wt Readings from Last 3 Encounters:  10/11/24 223 lb 12.8 oz (101.5 kg)  04/06/24 225 lb (102.1 kg)  10/08/23 255 lb 6.4 oz (115.8 kg)         Physical Exam Vitals reviewed.  Constitutional:      General: She is not in acute distress.    Appearance: Normal appearance. She is not ill-appearing, toxic-appearing or diaphoretic.  HENT:     Head: Normocephalic and atraumatic.     Right Ear: Tympanic membrane and external ear normal. There is no impacted cerumen.     Left Ear: Tympanic membrane and external ear normal. There is no impacted cerumen.     Nose: Nose normal.     Mouth/Throat:     Pharynx: Oropharynx is clear.  Eyes:     General: No scleral icterus.    Extraocular Movements: Extraocular movements intact.     Conjunctiva/sclera: Conjunctivae normal.     Pupils: Pupils are equal, round, and reactive to light.  Cardiovascular:     Rate and Rhythm: Normal rate and regular rhythm.     Pulses: Normal pulses.     Heart sounds: Normal heart sounds. No murmur heard.    No friction rub. No gallop.  Pulmonary:     Effort: Pulmonary effort is normal. No respiratory distress.     Breath sounds: Normal breath sounds. No wheezing, rhonchi or rales.  Abdominal:     General: Bowel sounds are normal. There is no distension.     Palpations: Abdomen is soft. There is no mass.     Tenderness: There is no abdominal tenderness. There is no guarding.  Musculoskeletal:        General: No deformity.     Cervical back: Normal range of motion and neck supple.     Right lower leg: No edema.     Left lower leg: No edema.  Lymphadenopathy:     Cervical: No cervical adenopathy.  Skin:    General: Skin is warm.     Capillary Refill: Capillary refill takes less than 2 seconds.     Findings: No erythema or rash.  Neurological:     General: No focal deficit present.     Mental Status: She is alert and oriented to person, place, and time.     Cranial Nerves: Cranial nerves 2-12 are intact. No cranial nerve deficit or facial asymmetry.     Motor: Motor function is intact. No weakness.     Gait: Gait normal.  Psychiatric:        Mood  and Affect: Mood normal.        Behavior: Behavior normal.       Last depression screening scores    10/11/2024    9:56 AM 04/06/2024    9:56 AM 10/08/2023   10:56 AM  PHQ 2/9 Scores  PHQ - 2 Score 0 0 0  PHQ- 9 Score 0 3       Data saved with a previous flowsheet row definition    Last fall risk screening    10/11/2024    9:56 AM  Fall Risk  Falls in the past year? 0  Number falls in past yr: 0  Injury with Fall? 0  Risk for fall due to : No Fall Risks  Follow up Falls evaluation completed    Last Audit-C alcohol use screening    04/05/2024    2:36 PM  Alcohol Use Disorder Test (AUDIT)  1. How often do you have a drink containing alcohol? 1  2. How many drinks containing alcohol do you have on a typical day when you are drinking? 0  3. How often do you have six or more drinks on one occasion? 0  AUDIT-C Score 1      Patient-reported   A score of 3 or more in women, and 4 or more in men indicates increased risk for alcohol abuse, EXCEPT if all of the points are from question 1   No results found for any visits on 10/11/24.  Assessment & Plan    Routine Health Maintenance and Physical Exam  Immunization History  Administered Date(s) Administered   Influenza, Seasonal, Injecte, Preservative Fre 10/08/2023, 10/11/2024   Influenza,inj,Quad PF,6+ Mos 12/31/2013, 08/23/2015   PFIZER(Purple Top)SARS-COV-2 Vaccination 05/13/2020, 06/03/2020   Td 01/16/2005   Tdap 12/31/2013, 04/06/2024   Zoster Recombinant(Shingrix ) 07/08/2023, 10/08/2023    Health Maintenance  Topic Date Due   Hepatitis B Vaccines 19-59 Average Risk (1 of 3 - 19+ 3-dose series) Never done   Pneumococcal Vaccine: 50+ Years (1 of 1 - PCV) Never done   COVID-19 Vaccine (3 - 2025-26 season) 10/27/2024 (Originally 08/02/2024)   Mammogram  07/17/2025   Cervical Cancer Screening (HPV/Pap Cotest)  01/21/2027   Colonoscopy  11/02/2027   DTaP/Tdap/Td (4 - Td or Tdap) 04/06/2034   Influenza Vaccine   Completed   Hepatitis C Screening  Completed   HIV Screening  Completed   Zoster Vaccines- Shingrix   Completed   HPV VACCINES  Aged Out   Meningococcal B Vaccine  Aged Out    Problem List Items Addressed This Visit     Annual physical exam - Primary   Avitaminosis D   Relevant Orders   VITAMIN D  25 Hydroxy (Vit-D Deficiency, Fractures)   Gastroesophageal reflux disease without esophagitis   Relevant Orders   Vitamin B12   Hypercholesteremia   Relevant Orders   Lipid panel   Iron deficiency anemia   Relevant Orders   CBC   Neuropathy   Relevant Orders   Hemoglobin A1c   CBC   CMP14+EGFR   Vitamin B12   Prediabetes   Relevant Orders   Hemoglobin A1c   Primary hypertension   Other Visit Diagnoses       Influenza vaccine needed       Relevant Orders   Flu vaccine trivalent PF, 6mos and older(Flulaval,Afluria,Fluarix,Fluzone) (Completed)     Hepatitis B vaccination status unknown       Relevant Orders   Hepatitis B Surface AntiBODY       Assessment and Plan Assessment & Plan Adult Wellness Visit Annual physical examination conducted. Blood pressure well controlled at 119/70 mmHg. Reports a well-balanced diet with a high protein, low carb regimen. Plans to restart exercise routine. No current exercise routine but plans to engage in 150 minutes of moderate intensity exercise per week for weight maintenance and 200-240 minutes for weight loss. - Ordered A1c, CBC, CMP, lipid panel, vitamin D , and vitamin B12 tests - Ordered mammogram for breast cancer screening - Administered flu vaccine - Ordered hepatitis B immunity titer  Vitamin D  deficiency Vitamin  D levels to be checked as part of routine screening. - Ordered vitamin D  level test  Hypercholesterolemia Lipid panel to be checked as part of routine screening. - Ordered lipid panel  Gastroesophageal reflux disease (GERD) - Ordered vitamin B12 level test  Hypertension Blood pressure well controlled at 119/70  mmHg.  Prediabetes A1c to be checked as part of diabetes screening. - Ordered A1c test  Polyneuropathy Vitamin B12 levels to be checked due to neuropathy. - Ordered vitamin B12 level test  Iron deficiency anemia CBC to be checked as part of anemia screening. - Ordered CBC  Immunization management Flu vaccine administered today. Prevnar vaccine deferred. Hepatitis B immunity titer to be checked to determine need for vaccination. - Administered flu vaccine - Ordered hepatitis B immunity titer       Return in about 6 months (around 04/10/2025) for HTN.       Rockie Agent, MD  [facility] [phone] (phone) [phone] (fax)  [facility]

## 2024-10-12 ENCOUNTER — Encounter: Payer: Self-pay | Admitting: Family Medicine

## 2024-10-12 ENCOUNTER — Ambulatory Visit: Payer: Self-pay | Admitting: Family Medicine

## 2024-10-12 DIAGNOSIS — I10 Essential (primary) hypertension: Secondary | ICD-10-CM

## 2024-10-12 LAB — CMP14+EGFR
ALT: 21 IU/L (ref 0–32)
AST: 24 IU/L (ref 0–40)
Albumin: 4.5 g/dL (ref 3.8–4.9)
Alkaline Phosphatase: 64 IU/L (ref 49–135)
BUN/Creatinine Ratio: 22 (ref 9–23)
BUN: 18 mg/dL (ref 6–24)
Bilirubin Total: 0.6 mg/dL (ref 0.0–1.2)
CO2: 24 mmol/L (ref 20–29)
Calcium: 10.6 mg/dL — ABNORMAL HIGH (ref 8.7–10.2)
Chloride: 102 mmol/L (ref 96–106)
Creatinine, Ser: 0.83 mg/dL (ref 0.57–1.00)
Globulin, Total: 2.9 g/dL (ref 1.5–4.5)
Glucose: 80 mg/dL (ref 70–99)
Potassium: 4.1 mmol/L (ref 3.5–5.2)
Sodium: 140 mmol/L (ref 134–144)
Total Protein: 7.4 g/dL (ref 6.0–8.5)
eGFR: 85 mL/min/1.73 (ref >59)

## 2024-10-12 LAB — HEMOGLOBIN A1C
Est. average glucose Bld gHb Est-mCnc: 108 mg/dL
Hgb A1c MFr Bld: 5.4 % (ref 4.8–5.6)

## 2024-10-12 LAB — CBC
Hematocrit: 39 % (ref 34.0–46.6)
Hemoglobin: 12.5 g/dL (ref 11.1–15.9)
MCH: 27.5 pg (ref 26.6–33.0)
MCHC: 32.1 g/dL (ref 31.5–35.7)
MCV: 86 fL (ref 79–97)
Platelets: 278 x10E3/uL (ref 150–450)
RBC: 4.55 x10E6/uL (ref 3.77–5.28)
RDW: 13.5 % (ref 11.7–15.4)
WBC: 4.2 x10E3/uL (ref 3.4–10.8)

## 2024-10-12 LAB — LIPID PANEL
Chol/HDL Ratio: 2.8 ratio (ref 0.0–4.4)
Cholesterol, Total: 197 mg/dL (ref 100–199)
HDL: 71 mg/dL (ref >39)
LDL Chol Calc (NIH): 111 mg/dL — ABNORMAL HIGH (ref 0–99)
Triglycerides: 83 mg/dL (ref 0–149)
VLDL Cholesterol Cal: 15 mg/dL (ref 5–40)

## 2024-10-12 LAB — VITAMIN B12: Vitamin B-12: 394 pg/mL (ref 232–1245)

## 2024-10-12 LAB — VITAMIN D 25 HYDROXY (VIT D DEFICIENCY, FRACTURES): Vit D, 25-Hydroxy: 39.2 ng/mL (ref 30.0–100.0)

## 2024-10-12 LAB — HEPATITIS B SURFACE ANTIBODY,QUALITATIVE: Hep B Surface Ab, Qual: NONREACTIVE

## 2024-10-13 MED ORDER — LISINOPRIL 20 MG PO TABS: 20.0000 mg | ORAL_TABLET | Freq: Every day | ORAL | 0 refills | Status: DC

## 2024-10-20 ENCOUNTER — Ambulatory Visit: Payer: Self-pay | Admitting: Family Medicine

## 2024-10-25 ENCOUNTER — Telehealth: Payer: Self-pay | Admitting: Family Medicine

## 2024-10-25 DIAGNOSIS — B009 Herpesviral infection, unspecified: Secondary | ICD-10-CM

## 2024-10-25 MED ORDER — VALACYCLOVIR HCL 500 MG PO TABS: 500.0000 mg | ORAL_TABLET | Freq: Every day | ORAL | 3 refills | Status: AC

## 2024-10-25 MED ORDER — ROSUVASTATIN CALCIUM 10 MG PO TABS: 10.0000 mg | ORAL_TABLET | Freq: Every day | ORAL | 3 refills | Status: AC

## 2024-10-25 NOTE — Telephone Encounter (Addendum)
 [facility] faxed refill request for the following medications:  rosuvastatin  (CRESTOR ) 10 MG tablet   valACYclovir  (VALTREX ) 500 MG tablet    Please advise.

## 2024-11-08 ENCOUNTER — Ambulatory Visit: Admitting: Dermatology

## 2024-11-08 ENCOUNTER — Encounter: Payer: Self-pay | Admitting: Dermatology

## 2024-11-08 VITALS — BP 121/76 | HR 67

## 2024-11-08 DIAGNOSIS — L739 Follicular disorder, unspecified: Secondary | ICD-10-CM

## 2024-11-08 DIAGNOSIS — L910 Hypertrophic scar: Secondary | ICD-10-CM | POA: Diagnosis not present

## 2024-11-08 MED ORDER — TRIAMCINOLONE ACETONIDE 10 MG/ML IJ SUSP
10.0000 mg | Freq: Once | INTRAMUSCULAR | Status: AC
  Administered 2024-11-08: 10 mg

## 2024-11-08 NOTE — Patient Instructions (Addendum)
 VISIT SUMMARY:  Today, we addressed your recurrent keloids and acneiform lesions, particularly in the groin and breast areas. We discussed your previous treatments and administered Kenalog  injections to help flatten and lighten the keloidal nodules. We also talked about managing chronic folliculitis with a benzoyl peroxide wash to reduce bacterial colonization and prevent new flares.  YOUR PLAN:  -KELOID AND HYPERTROPHIC SCARRING:  Keloids and hypertrophic scars are firm, raised areas of skin that result from an overgrowth of tissue at the site of a healed skin injury. We administered Kenalog  injections to help flatten and lighten the keloidal nodules. Be aware that potential side effects of these injections include temporary divots and lightening of the surrounding skin. We will reassess in 4-5 months to determine if further injections are needed.  -CHRONIC FOLLICULITIS:  Chronic folliculitis is the inflammation of hair follicles, often worsened by sweating and friction from clothing. This can lead to the formation of keloids. To help manage this, we recommend using a benzoyl peroxide wash, such as Panaxil or CeraVe, before showering to reduce bacterial colonization and prevent new flares. We provided you with a sample of CeraVe benzoyl peroxide wash.  INSTRUCTIONS:  Please schedule a follow-up appointment in 4-5 months to assess the need for further Kenalog  injections.       Important Information   Due to recent changes in healthcare laws, you may see results of your pathology and/or laboratory studies on MyChart before the doctors have had a chance to review them. We understand that in some cases there may be results that are confusing or concerning to you. Please understand that not all results are received at the same time and often the doctors may need to interpret multiple results in order to provide you with the best plan of care or course of treatment. Therefore, we ask that you  please give us  2 business days to thoroughly review all your results before contacting the office for clarification. Should we see a critical lab result, you will be contacted sooner.     If You Need Anything After Your Visit   If you have any questions or concerns for your doctor, please call our main line at [phone]. If no one answers, please leave a voicemail as directed and we will return your call as soon as possible. Messages left after 4 pm will be answered the following business day.    You may also send us  a message via MyChart. We typically respond to MyChart messages within 1-2 business days.  For prescription refills, please ask your pharmacy to contact our office. Our fax number is [phone].  If you have an urgent issue when the clinic is closed that cannot wait until the next business day, you can page your doctor at the number below.     Please note that while we do our best to be available for urgent issues outside of office hours, we are not available 24/7.    If you have an urgent issue and are unable to reach us , you may choose to seek medical care at your doctor's office, retail clinic, urgent care center, or emergency room.   If you have a medical emergency, please immediately call 911 or go to the emergency department. In the event of inclement weather, please call our main line at [phone] for an update on the status of any delays or closures.  Dermatology Medication Tips: Please keep the boxes that topical medications come in in order to help keep track of the  instructions about where and how to use these. Pharmacies typically print the medication instructions only on the boxes and not directly on the medication tubes.   If your medication is too expensive, please contact our office at [phone] or send us  a message through MyChart.    We are unable to tell what your co-pay for medications will be in advance as this is different depending on your  insurance coverage. However, we may be able to find a substitute medication at lower cost or fill out paperwork to get insurance to cover a needed medication.    If a prior authorization is required to get your medication covered by your insurance company, please allow us  1-2 business days to complete this process.   Drug prices often vary depending on where the prescription is filled and some pharmacies may offer cheaper prices.   The website www.goodrx.com contains coupons for medications through different pharmacies. The prices here do not account for what the cost may be with help from insurance (it may be cheaper with your insurance), but the website can give you the price if you did not use any insurance.  - You can print the associated coupon and take it with your prescription to the pharmacy.  - You may also stop by our office during regular business hours and pick up a GoodRx coupon card.  - If you need your prescription sent electronically to a different pharmacy, notify our office through [facility] or by phone at [phone]

## 2024-11-08 NOTE — Progress Notes (Signed)
   New Patient Visit   Subjective  Sarah Monroe is a 53 y.o. female who presents for the following: Keloid & Folliculitis   Patient states she has spots located at the scattered that she would like to have examined. Patient reports the areas have been there for several years. She reports the areas can be bothersome.Patient rates irritation 10 out of 10. She states that the areas have spread. Patient reports she has previously been treated for these areas.She reports she has had ILK injections completed back in 2014. Patient denies Hx of bx. Patient denies family history of skin cancer(s).  Patient provided verbal consent for the use of an AI-assisted program to generate a detailed after-visit summary. The patient understands that the AI tool is used to support clinical documentation and that all information will be reviewed and verified by the healthcare provider.  The following portions of the chart were reviewed this encounter and updated as appropriate: medications, allergies, medical history  Review of Systems:  No other skin or systemic complaints except as noted in HPI or Assessment and Plan.  Objective  Well appearing patient in no apparent distress; mood and affect are within normal limits.  A full examination was performed including scalp, head, eyes, ears, nose, lips, neck, chest, axillae, abdomen, back, buttocks, bilateral upper extremities, bilateral lower extremities, hands, feet, fingers, toes, fingernails, and toenails. All findings within normal limits unless otherwise noted below.   Relevant exam findings are noted in the Assessment and Plan.             Left Breast (3), Left Flank, Left Suprapubic Area, Right Breast (2), Right Flank, Right Suprapubic Area Firm brown dermal papule(s) Assessment & Plan   KELOID Exam: Firm pink/brown dermal papule(s)  Treatment Plan: - ILK Injections Completed while in office today  CHRONIC FOLLICULITIS Exam: Perifollicular  erythematous papules and pustules  Folliculitis occurs due to inflammation of the superficial hair follicle (pore), resulting in acne-like lesions (pus bumps). It can be infectious (bacterial, fungal) or noninfectious (shaving, tight clothing, heat/sweat, medications).  Folliculitis can be acute or chronic and recommended treatment depends on the underlying cause of folliculitis.  Treatment Plan: - Recommended washing with BPO (CeraVE or Panoxyl) daily KELOID (9) Left Breast (3), Left Flank, Left Suprapubic Area, Right Breast (2), Right Flank, Right Suprapubic Area Procedure Note Intralesional Injection  Location: B/L Breast and Suprapubic Area  Informed Consent: Discussed risks (infection, pain, bleeding, bruising, thinning of the skin, loss of skin pigment, lack of resolution, and recurrence of lesion) and benefits of the procedure, as well as the alternatives. Informed consent was obtained. Preparation: The area was prepared a standard fashion.  Anesthesia:None  Procedure Details: An intralesional injection was performed with Kenalog  10 mg/cc. 0.25 cc in total were injected. NDC #: 9996-9505-79 Exp: [id number] LOT: 01/2025  Total number of injections: 9  Plan: The patient was instructed on post-op care. Recommend OTC analgesia as needed for pain.  Related Medications triamcinolone  acetonide (KENALOG ) 10 MG/ML injection 10 mg   Return in about 6 months (around 05/09/2025) for Keloid with secondary PIH & Hair Loss F/U.  I, Jetta Ager, am acting as neurosurgeon for [employer], DO.  Documentation: I have reviewed the above documentation for accuracy and completeness, and I agree with the above.  Delon Lenis, DO

## 2024-11-30 ENCOUNTER — Encounter: Payer: Self-pay | Admitting: Family Medicine

## 2024-11-30 ENCOUNTER — Ambulatory Visit: Admitting: Family Medicine

## 2024-11-30 VITALS — BP 130/73 | HR 69 | Ht 61.0 in | Wt 224.9 lb

## 2024-11-30 DIAGNOSIS — I10 Essential (primary) hypertension: Secondary | ICD-10-CM | POA: Diagnosis not present

## 2024-11-30 DIAGNOSIS — Z23 Encounter for immunization: Secondary | ICD-10-CM

## 2024-11-30 DIAGNOSIS — N951 Menopausal and female climacteric states: Secondary | ICD-10-CM | POA: Insufficient documentation

## 2024-11-30 DIAGNOSIS — K219 Gastro-esophageal reflux disease without esophagitis: Secondary | ICD-10-CM | POA: Diagnosis not present

## 2024-11-30 DIAGNOSIS — E559 Vitamin D deficiency, unspecified: Secondary | ICD-10-CM

## 2024-11-30 DIAGNOSIS — Z6841 Body Mass Index (BMI) 40.0 and over, adult: Secondary | ICD-10-CM

## 2024-11-30 DIAGNOSIS — E7800 Pure hypercholesterolemia, unspecified: Secondary | ICD-10-CM

## 2024-11-30 MED ORDER — GABAPENTIN 300 MG PO CAPS: 300.0000 mg | ORAL_CAPSULE | Freq: Every day | ORAL | 1 refills | Status: AC

## 2024-11-30 NOTE — Progress Notes (Unsigned)
 "  Established Patient Office Visit  Patient ID: Sarah Monroe, female    DOB: 10/18/1971  Age: 53 y.o. MRN: [id number] PCP: Sharma Coyer, MD  Chief Complaint  Patient presents with   Medical Management of Chronic Issues    Patient is present for 6 week BP f/u after med change,   Patient states no side affects with meds , states she's feeling fine today.     Subjective:     HPI  Discussed the use of AI scribe software for clinical note transcription with the patient, who gave verbal consent to proceed.  History of Present Illness Sarah Monroe is a 53 year old female with chronic hypertension who presents for follow-up.  She continues lisinopril  20 mg daily. Previously, she was on a combination of lisinopril  with hydrochlorothiazide , but this was stopped after elevated calcium  levels were noted. She is monitoring for potential side effects such as a dry cough.  She manages chronic hyperlipidemia with Crestor  10 mg daily and obesity with tirzepatide  10 mg weekly. She notes a slight weight gain, which she attributes to skipping a dose during a vacation. She prefers not to weigh herself frequently, focusing instead on how she feels and her clothing fit. She is aware of fluctuations due to menopause and is not overly concerned with the scale numbers as long as her blood work remains normal.  She experiences menopausal symptoms, including hot flashes, cold spells, and insomnia, which have been more pronounced over the past three weeks. She describes waking up around 2-3 AM and struggling to return to sleep. She has a history of fibroids and experiences light spotting annually in March, which resets her menopause timeline. She is not currently on hormone therapy and is managing symptoms without it.  She has a history of vitamin D  deficiency and is taking 50,000 units weekly. She also takes Protonix  40 mg daily for GERD.  She has been prescribed gabapentin  300 mg at bedtime  for numbness in her left foot, which she does not take regularly. She notes it did not make her sleepy when she took it previously. She also experiences tingling in her fingers, which she has not associated with menopause symptoms.  She recently visited a dermatologist for keloids and received a steroid injection, which has helped flatten them. She has a follow-up scheduled in four to six months.   Patient Active Problem List   Diagnosis Date Noted   Insomnia associated with menopause 11/30/2024   Hyperpigmentation 04/06/2024   Vasomotor symptoms due to menopause 10/08/2023   Family history of sleep apnea 10/08/2023   Adenomatous polyp of colon 11/01/2022   Annual physical exam 07/12/2022   History of colon polyps 07/12/2022   Family history of colon cancer in mother 07/12/2022   Gastroesophageal reflux disease without esophagitis 07/12/2022   Chronic left-sided low back pain with left-sided sciatica 07/12/2022   Primary hypertension 03/18/2022   Prediabetes 03/18/2022   Iron deficiency anemia 03/18/2022   Motion sickness 03/18/2022   Neuropathy 07/23/2021   Morbidly obese (HCC) 09/29/2015   Avitaminosis D 08/23/2015   Allergic rhinitis 08/22/2015   Hypercholesteremia 08/22/2015   Allergies[1]    ROS    Objective:     BP 130/73 (BP Location: Right Arm, Patient Position: Sitting, Cuff Size: Normal)   Pulse 69   Ht 5' 1 (1.549 m)   Wt 224 lb 14.4 oz (102 kg)   SpO2 100%   BMI 42.49 kg/m  BP Readings from Last 3 Encounters:  11/30/24 130/73  11/08/24 121/76  10/11/24 119/64   Wt Readings from Last 3 Encounters:  11/30/24 224 lb 14.4 oz (102 kg)  10/11/24 223 lb 12.8 oz (101.5 kg)  04/06/24 225 lb (102.1 kg)      Physical Exam Vitals reviewed.  Constitutional:      General: She is not in acute distress.    Appearance: Normal appearance. She is not ill-appearing.  Cardiovascular:     Rate and Rhythm: Normal rate and regular rhythm.  Pulmonary:     Effort:  Pulmonary effort is normal. No respiratory distress.     Breath sounds: No wheezing, rhonchi or rales.  Neurological:     Mental Status: She is alert and oriented to person, place, and time.  Psychiatric:        Mood and Affect: Mood normal.        Behavior: Behavior normal.      Results for orders placed or performed in visit on 11/30/24  [facility]  Result Value Ref Range   Glucose 77 70 - 99 mg/dL   BUN 10 6 - 24 mg/dL   Creatinine, Ser 9.17 0.57 - 1.00 mg/dL   eGFR 86 >40 fO/fpw/8.26   BUN/Creatinine Ratio 12 9 - 23   Sodium 144 134 - 144 mmol/L   Potassium 4.2 3.5 - 5.2 mmol/L   Chloride 105 96 - 106 mmol/L   CO2 23 20 - 29 mmol/L   Calcium  10.0 8.7 - 10.2 mg/dL    Last metabolic panel Lab Results  Component Value Date   GLUCOSE 77 11/30/2024   NA 144 11/30/2024   K 4.2 11/30/2024   CL 105 11/30/2024   CO2 23 11/30/2024   BUN 10 11/30/2024   CREATININE 0.82 11/30/2024   EGFR 86 11/30/2024   CALCIUM  10.0 11/30/2024   PROT 7.4 10/11/2024   ALBUMIN 4.5 10/11/2024   LABGLOB 2.9 10/11/2024   AGRATIO 1.6 07/12/2022   BILITOT 0.6 10/11/2024   ALKPHOS 64 10/11/2024   AST 24 10/11/2024   ALT 21 10/11/2024   ANIONGAP 8 08/13/2019   Last lipids Lab Results  Component Value Date   CHOL 197 10/11/2024   HDL 71 10/11/2024   LDLCALC 111 (H) 10/11/2024   TRIG 83 10/11/2024   CHOLHDL 2.8 10/11/2024   Last hemoglobin A1c Lab Results  Component Value Date   HGBA1C 5.4 10/11/2024      The 10-year ASCVD risk score (Arnett DK, et al., 2019) is: 3.1%    Assessment & Plan:   Problem List Items Addressed This Visit     Avitaminosis D   Gastroesophageal reflux disease without esophagitis   Hypercholesteremia   Insomnia associated with menopause   Relevant Medications   gabapentin  (NEURONTIN ) 300 MG capsule   Morbidly obese (HCC)   Primary hypertension - Primary   Relevant Orders   BMP8+EGFR (Completed)   Vasomotor symptoms due to menopause   Relevant  Medications   gabapentin  (NEURONTIN ) 300 MG capsule   Other Visit Diagnoses       Hypercalcemia       Relevant Orders   BMP8+EGFR (Completed)     Immunization due       Relevant Orders   Heplisav-B (HepB-CPG) Vaccine (Completed)   Pneumococcal conjugate vaccine 20-valent (Prevnar 20) (Completed)       Assessment and Plan Assessment & Plan Primary hypertension Chronic  Blood pressure is well-controlled at 130/73 mmHg. Previous combination therapy with lisinopril  and hydrochlorothiazide  was discontinued due to hypercalcemia. - Continue  lisinopril  20 mg daily  Morbid obesity, class III Chronic  BMI is 42. Weight management is ongoing with tirzepatide . Recent weight gain attributed to skipping a week of medication during a trip. - Continue tirzepatide  10 mg weekly  Hypercholesterolemia Chronic  Cholesterol levels are within normal range. - Continue Crestor  10 mg daily  Gastroesophageal reflux disease Chronic  GERD is managed with Protonix . - Continue Protonix  40 mg daily  Vitamin D  deficiency Chronic  Vitamin D  levels are stable with current supplementation. - Continue vitamin D  50,000 units weekly  Hypercalcemia Previous hypercalcemia likely due to hydrochlorothiazide . Calcium  level was 10.6 mg/dL. Discontinuation of hydrochlorothiazide  is expected to normalize calcium  levels. - Ordered basic metabolic panel to recheck calcium  levels  Menopausal symptoms Chronic  Experiencing vasomotor symptoms including hot flashes, night sweats, and insomnia. Gabapentin  was previously prescribed for neuropathy but may also help with menopausal symptoms. - Restart gabapentin  300 mg at bedtime for menopausal symptoms and neuropathy  Peripheral neuropathy Chronic  Numbness in the left foot and tingling in the fingers. Gabapentin  may help with neuropathic symptoms. - Restart gabapentin  300 mg at bedtime  General health maintenance Received Heplisav B vaccine and pneumococcal  vaccine today. Dermatology follow-up for keloids scheduled in 4-6 months. - Continue routine health maintenance and vaccinations as scheduled    No follow-ups on file.    Rockie Agent, MD [facility]      [1] No Known Allergies  "

## 2024-11-30 NOTE — Patient Instructions (Signed)
 To keep you healthy, please keep in mind the following health maintenance items that you are due for:   Health Maintenance Due  Topic Date Due   Hepatitis B Vaccines 19-59 Average Risk (1 of 3 - 19+ 3-dose series) Never done   Pneumococcal Vaccine: 50+ Years (1 of 1 - PCV) Never done     Best Wishes,   Dr. Lang

## 2024-12-01 LAB — BMP8+EGFR
BUN/Creatinine Ratio: 12 (ref 9–23)
BUN: 10 mg/dL (ref 6–24)
CO2: 23 mmol/L (ref 20–29)
Calcium: 10 mg/dL (ref 8.7–10.2)
Chloride: 105 mmol/L (ref 96–106)
Creatinine, Ser: 0.82 mg/dL (ref 0.57–1.00)
Glucose: 77 mg/dL (ref 70–99)
Potassium: 4.2 mmol/L (ref 3.5–5.2)
Sodium: 144 mmol/L (ref 134–144)
eGFR: 86 mL/min/1.73

## 2024-12-06 ENCOUNTER — Ambulatory Visit: Payer: Self-pay | Admitting: Family Medicine

## 2024-12-13 ENCOUNTER — Other Ambulatory Visit: Payer: Self-pay | Admitting: Family Medicine

## 2024-12-31 ENCOUNTER — Other Ambulatory Visit: Payer: Self-pay | Admitting: Family Medicine

## 2024-12-31 DIAGNOSIS — I10 Essential (primary) hypertension: Secondary | ICD-10-CM

## 2025-03-02 ENCOUNTER — Ambulatory Visit: Admitting: Dermatology

## 2025-04-07 ENCOUNTER — Ambulatory Visit: Admitting: Family Medicine

## 2025-04-11 ENCOUNTER — Ambulatory Visit: Admitting: Family Medicine

## 2025-04-28 ENCOUNTER — Ambulatory Visit: Admitting: Family Medicine
